# Patient Record
Sex: Female | Born: 1935 | Race: White | Hispanic: No | Smoking: Never smoker
Health system: Southern US, Community
[De-identification: ages and names within clinical notes are randomized; demographics above are authoritative.]

## PROBLEM LIST (undated history)

## (undated) DIAGNOSIS — I10 Essential (primary) hypertension: Secondary | ICD-10-CM

## (undated) DIAGNOSIS — E7800 Pure hypercholesterolemia, unspecified: Secondary | ICD-10-CM

## (undated) DIAGNOSIS — K859 Acute pancreatitis without necrosis or infection, unspecified: Secondary | ICD-10-CM

## (undated) DIAGNOSIS — K5792 Diverticulitis of intestine, part unspecified, without perforation or abscess without bleeding: Secondary | ICD-10-CM

## (undated) DIAGNOSIS — E039 Hypothyroidism, unspecified: Secondary | ICD-10-CM

## (undated) HISTORY — PX: TONSILLECTOMY: SUR1361

## (undated) HISTORY — DX: Acute pancreatitis without necrosis or infection, unspecified: K85.90

## (undated) HISTORY — DX: Diverticulitis of intestine, part unspecified, without perforation or abscess without bleeding: K57.92

---

## 2004-11-16 ENCOUNTER — Ambulatory Visit: Payer: Self-pay | Admitting: Internal Medicine

## 2007-07-11 ENCOUNTER — Ambulatory Visit: Payer: Self-pay | Admitting: Internal Medicine

## 2008-04-05 ENCOUNTER — Ambulatory Visit: Payer: Self-pay | Admitting: Internal Medicine

## 2009-05-25 ENCOUNTER — Ambulatory Visit: Payer: Self-pay | Admitting: Internal Medicine

## 2010-02-26 ENCOUNTER — Observation Stay: Payer: Self-pay | Admitting: Internal Medicine

## 2010-05-28 ENCOUNTER — Ambulatory Visit: Payer: Self-pay | Admitting: Internal Medicine

## 2010-12-05 ENCOUNTER — Encounter (INDEPENDENT_AMBULATORY_CARE_PROVIDER_SITE_OTHER): Payer: Self-pay | Admitting: Ophthalmology

## 2010-12-21 ENCOUNTER — Encounter (INDEPENDENT_AMBULATORY_CARE_PROVIDER_SITE_OTHER): Payer: Medicare Other | Admitting: Ophthalmology

## 2010-12-21 DIAGNOSIS — H43819 Vitreous degeneration, unspecified eye: Secondary | ICD-10-CM

## 2010-12-21 DIAGNOSIS — H3530 Unspecified macular degeneration: Secondary | ICD-10-CM

## 2010-12-21 DIAGNOSIS — H353290 Exudative age-related macular degeneration, unspecified eye, stage unspecified: Secondary | ICD-10-CM

## 2011-02-08 ENCOUNTER — Ambulatory Visit (INDEPENDENT_AMBULATORY_CARE_PROVIDER_SITE_OTHER): Payer: Medicare Other | Admitting: Ophthalmology

## 2011-02-08 DIAGNOSIS — H43819 Vitreous degeneration, unspecified eye: Secondary | ICD-10-CM

## 2011-02-08 DIAGNOSIS — H3530 Unspecified macular degeneration: Secondary | ICD-10-CM

## 2011-02-08 DIAGNOSIS — H353290 Exudative age-related macular degeneration, unspecified eye, stage unspecified: Secondary | ICD-10-CM

## 2011-02-08 DIAGNOSIS — H359 Unspecified retinal disorder: Secondary | ICD-10-CM

## 2011-02-08 NOTE — Nursing Note (Signed)
>>   Sherrie George, MD     Mandy George Feb 08, 2011  8:22 AM Evaluation for ARMD and Choroidal neovascularization OS

## 2011-02-17 NOTE — Progress Notes (Signed)
Complete eye exam

## 2011-02-18 ENCOUNTER — Encounter (INDEPENDENT_AMBULATORY_CARE_PROVIDER_SITE_OTHER): Payer: Medicare Other | Admitting: Ophthalmology

## 2011-04-05 ENCOUNTER — Encounter (INDEPENDENT_AMBULATORY_CARE_PROVIDER_SITE_OTHER): Payer: Medicare Other | Admitting: Ophthalmology

## 2011-04-05 DIAGNOSIS — H353290 Exudative age-related macular degeneration, unspecified eye, stage unspecified: Secondary | ICD-10-CM

## 2011-04-05 DIAGNOSIS — H43819 Vitreous degeneration, unspecified eye: Secondary | ICD-10-CM

## 2011-04-05 DIAGNOSIS — H3530 Unspecified macular degeneration: Secondary | ICD-10-CM

## 2011-05-23 ENCOUNTER — Ambulatory Visit: Payer: Self-pay | Admitting: Internal Medicine

## 2011-06-07 ENCOUNTER — Encounter (INDEPENDENT_AMBULATORY_CARE_PROVIDER_SITE_OTHER): Payer: Medicare Other | Admitting: Ophthalmology

## 2011-06-07 DIAGNOSIS — H35039 Hypertensive retinopathy, unspecified eye: Secondary | ICD-10-CM

## 2011-06-07 DIAGNOSIS — I10 Essential (primary) hypertension: Secondary | ICD-10-CM

## 2011-06-07 DIAGNOSIS — H3530 Unspecified macular degeneration: Secondary | ICD-10-CM

## 2011-06-07 DIAGNOSIS — H353290 Exudative age-related macular degeneration, unspecified eye, stage unspecified: Secondary | ICD-10-CM

## 2011-06-07 DIAGNOSIS — H43819 Vitreous degeneration, unspecified eye: Secondary | ICD-10-CM

## 2011-08-23 ENCOUNTER — Encounter (INDEPENDENT_AMBULATORY_CARE_PROVIDER_SITE_OTHER): Payer: Medicare Other | Admitting: Ophthalmology

## 2011-08-26 ENCOUNTER — Encounter (INDEPENDENT_AMBULATORY_CARE_PROVIDER_SITE_OTHER): Payer: Medicare Other | Admitting: Ophthalmology

## 2011-08-26 DIAGNOSIS — I10 Essential (primary) hypertension: Secondary | ICD-10-CM

## 2011-08-26 DIAGNOSIS — H353290 Exudative age-related macular degeneration, unspecified eye, stage unspecified: Secondary | ICD-10-CM

## 2011-08-26 DIAGNOSIS — H3530 Unspecified macular degeneration: Secondary | ICD-10-CM

## 2011-08-26 DIAGNOSIS — H43819 Vitreous degeneration, unspecified eye: Secondary | ICD-10-CM

## 2011-08-26 DIAGNOSIS — H35039 Hypertensive retinopathy, unspecified eye: Secondary | ICD-10-CM

## 2011-12-02 ENCOUNTER — Encounter (INDEPENDENT_AMBULATORY_CARE_PROVIDER_SITE_OTHER): Payer: Medicare Other | Admitting: Ophthalmology

## 2011-12-02 DIAGNOSIS — H3530 Unspecified macular degeneration: Secondary | ICD-10-CM

## 2011-12-02 DIAGNOSIS — H353290 Exudative age-related macular degeneration, unspecified eye, stage unspecified: Secondary | ICD-10-CM

## 2011-12-02 DIAGNOSIS — H43819 Vitreous degeneration, unspecified eye: Secondary | ICD-10-CM

## 2011-12-02 DIAGNOSIS — H35039 Hypertensive retinopathy, unspecified eye: Secondary | ICD-10-CM

## 2011-12-02 DIAGNOSIS — I10 Essential (primary) hypertension: Secondary | ICD-10-CM

## 2012-01-08 ENCOUNTER — Ambulatory Visit: Payer: Self-pay | Admitting: Internal Medicine

## 2012-01-27 ENCOUNTER — Encounter (INDEPENDENT_AMBULATORY_CARE_PROVIDER_SITE_OTHER): Payer: Medicare Other | Admitting: Ophthalmology

## 2012-01-27 DIAGNOSIS — I10 Essential (primary) hypertension: Secondary | ICD-10-CM

## 2012-01-27 DIAGNOSIS — H35039 Hypertensive retinopathy, unspecified eye: Secondary | ICD-10-CM

## 2012-01-27 DIAGNOSIS — H3530 Unspecified macular degeneration: Secondary | ICD-10-CM

## 2012-01-27 DIAGNOSIS — H43819 Vitreous degeneration, unspecified eye: Secondary | ICD-10-CM

## 2012-03-19 ENCOUNTER — Encounter (INDEPENDENT_AMBULATORY_CARE_PROVIDER_SITE_OTHER): Payer: Medicare Other | Admitting: Ophthalmology

## 2012-03-19 DIAGNOSIS — H35349 Macular cyst, hole, or pseudohole, unspecified eye: Secondary | ICD-10-CM

## 2012-03-19 DIAGNOSIS — H43819 Vitreous degeneration, unspecified eye: Secondary | ICD-10-CM

## 2012-03-19 DIAGNOSIS — I10 Essential (primary) hypertension: Secondary | ICD-10-CM

## 2012-03-19 DIAGNOSIS — H35039 Hypertensive retinopathy, unspecified eye: Secondary | ICD-10-CM

## 2012-03-19 DIAGNOSIS — H3530 Unspecified macular degeneration: Secondary | ICD-10-CM

## 2012-07-09 ENCOUNTER — Encounter (INDEPENDENT_AMBULATORY_CARE_PROVIDER_SITE_OTHER): Payer: Medicare Other | Admitting: Ophthalmology

## 2012-07-13 ENCOUNTER — Encounter (INDEPENDENT_AMBULATORY_CARE_PROVIDER_SITE_OTHER): Payer: Medicare Other | Admitting: Ophthalmology

## 2012-07-15 ENCOUNTER — Encounter (INDEPENDENT_AMBULATORY_CARE_PROVIDER_SITE_OTHER): Payer: Medicare Other | Admitting: Ophthalmology

## 2012-07-15 DIAGNOSIS — H3530 Unspecified macular degeneration: Secondary | ICD-10-CM

## 2012-07-15 DIAGNOSIS — H35349 Macular cyst, hole, or pseudohole, unspecified eye: Secondary | ICD-10-CM

## 2012-07-15 DIAGNOSIS — I10 Essential (primary) hypertension: Secondary | ICD-10-CM

## 2012-07-15 DIAGNOSIS — H43819 Vitreous degeneration, unspecified eye: Secondary | ICD-10-CM

## 2012-07-15 DIAGNOSIS — H35039 Hypertensive retinopathy, unspecified eye: Secondary | ICD-10-CM

## 2012-10-22 ENCOUNTER — Encounter (INDEPENDENT_AMBULATORY_CARE_PROVIDER_SITE_OTHER): Payer: Medicare Other | Admitting: Ophthalmology

## 2012-10-22 DIAGNOSIS — H35349 Macular cyst, hole, or pseudohole, unspecified eye: Secondary | ICD-10-CM

## 2012-10-22 DIAGNOSIS — H43819 Vitreous degeneration, unspecified eye: Secondary | ICD-10-CM

## 2012-10-22 DIAGNOSIS — H35039 Hypertensive retinopathy, unspecified eye: Secondary | ICD-10-CM

## 2012-10-22 DIAGNOSIS — I10 Essential (primary) hypertension: Secondary | ICD-10-CM

## 2012-10-22 DIAGNOSIS — H353290 Exudative age-related macular degeneration, unspecified eye, stage unspecified: Secondary | ICD-10-CM

## 2012-10-22 DIAGNOSIS — H3530 Unspecified macular degeneration: Secondary | ICD-10-CM

## 2013-01-07 ENCOUNTER — Encounter (INDEPENDENT_AMBULATORY_CARE_PROVIDER_SITE_OTHER): Payer: Medicare Other | Admitting: Ophthalmology

## 2013-01-07 DIAGNOSIS — I10 Essential (primary) hypertension: Secondary | ICD-10-CM

## 2013-01-07 DIAGNOSIS — H43819 Vitreous degeneration, unspecified eye: Secondary | ICD-10-CM

## 2013-01-07 DIAGNOSIS — H35039 Hypertensive retinopathy, unspecified eye: Secondary | ICD-10-CM

## 2013-01-07 DIAGNOSIS — H3530 Unspecified macular degeneration: Secondary | ICD-10-CM

## 2013-04-08 ENCOUNTER — Encounter (INDEPENDENT_AMBULATORY_CARE_PROVIDER_SITE_OTHER): Payer: Medicare Other | Admitting: Ophthalmology

## 2013-04-08 DIAGNOSIS — H43819 Vitreous degeneration, unspecified eye: Secondary | ICD-10-CM

## 2013-04-08 DIAGNOSIS — H35039 Hypertensive retinopathy, unspecified eye: Secondary | ICD-10-CM

## 2013-04-08 DIAGNOSIS — I10 Essential (primary) hypertension: Secondary | ICD-10-CM

## 2013-04-08 DIAGNOSIS — H35349 Macular cyst, hole, or pseudohole, unspecified eye: Secondary | ICD-10-CM

## 2013-04-08 DIAGNOSIS — H3530 Unspecified macular degeneration: Secondary | ICD-10-CM

## 2013-05-19 ENCOUNTER — Ambulatory Visit: Payer: Self-pay | Admitting: Internal Medicine

## 2013-07-25 ENCOUNTER — Emergency Department: Payer: Self-pay | Admitting: Emergency Medicine

## 2013-07-25 LAB — COMPREHENSIVE METABOLIC PANEL
ALK PHOS: 93 U/L
ANION GAP: 5 — AB (ref 7–16)
Albumin: 3.8 g/dL (ref 3.4–5.0)
BUN: 19 mg/dL — ABNORMAL HIGH (ref 7–18)
Bilirubin,Total: 0.5 mg/dL (ref 0.2–1.0)
Calcium, Total: 9.5 mg/dL (ref 8.5–10.1)
Chloride: 98 mmol/L (ref 98–107)
Co2: 32 mmol/L (ref 21–32)
Creatinine: 1.04 mg/dL (ref 0.60–1.30)
EGFR (Non-African Amer.): 48 — ABNORMAL LOW
GFR CALC AF AMER: 56 — AB
Glucose: 95 mg/dL (ref 65–99)
Osmolality: 272 (ref 275–301)
POTASSIUM: 3.1 mmol/L — AB (ref 3.5–5.1)
SGOT(AST): 19 U/L (ref 15–37)
SGPT (ALT): 18 U/L (ref 12–78)
SODIUM: 135 mmol/L — AB (ref 136–145)
TOTAL PROTEIN: 7.6 g/dL (ref 6.4–8.2)

## 2013-07-25 LAB — CBC
HCT: 40.1 % (ref 35.0–47.0)
HGB: 13.3 g/dL (ref 12.0–16.0)
MCH: 28.6 pg (ref 26.0–34.0)
MCHC: 33.1 g/dL (ref 32.0–36.0)
MCV: 87 fL (ref 80–100)
Platelet: 327 10*3/uL (ref 150–440)
RBC: 4.63 10*6/uL (ref 3.80–5.20)
RDW: 14.2 % (ref 11.5–14.5)
WBC: 8.8 10*3/uL (ref 3.6–11.0)

## 2013-07-25 LAB — TROPONIN I: Troponin-I: <0.02 ng/mL

## 2013-08-26 ENCOUNTER — Encounter (INDEPENDENT_AMBULATORY_CARE_PROVIDER_SITE_OTHER): Payer: Medicare Other | Admitting: Ophthalmology

## 2013-09-03 ENCOUNTER — Encounter (INDEPENDENT_AMBULATORY_CARE_PROVIDER_SITE_OTHER): Payer: Medicare Other | Admitting: Ophthalmology

## 2013-09-06 ENCOUNTER — Encounter (INDEPENDENT_AMBULATORY_CARE_PROVIDER_SITE_OTHER): Payer: Medicare Other | Admitting: Ophthalmology

## 2013-09-06 DIAGNOSIS — I10 Essential (primary) hypertension: Secondary | ICD-10-CM

## 2013-09-06 DIAGNOSIS — H353290 Exudative age-related macular degeneration, unspecified eye, stage unspecified: Secondary | ICD-10-CM

## 2013-09-06 DIAGNOSIS — H35039 Hypertensive retinopathy, unspecified eye: Secondary | ICD-10-CM

## 2013-09-06 DIAGNOSIS — H43819 Vitreous degeneration, unspecified eye: Secondary | ICD-10-CM

## 2013-09-06 DIAGNOSIS — H3530 Unspecified macular degeneration: Secondary | ICD-10-CM

## 2013-11-08 ENCOUNTER — Encounter (INDEPENDENT_AMBULATORY_CARE_PROVIDER_SITE_OTHER): Payer: Medicare Other | Admitting: Ophthalmology

## 2013-11-08 DIAGNOSIS — H43819 Vitreous degeneration, unspecified eye: Secondary | ICD-10-CM

## 2013-11-08 DIAGNOSIS — H35039 Hypertensive retinopathy, unspecified eye: Secondary | ICD-10-CM

## 2013-11-08 DIAGNOSIS — H3530 Unspecified macular degeneration: Secondary | ICD-10-CM

## 2013-11-08 DIAGNOSIS — H353290 Exudative age-related macular degeneration, unspecified eye, stage unspecified: Secondary | ICD-10-CM

## 2013-11-08 DIAGNOSIS — I10 Essential (primary) hypertension: Secondary | ICD-10-CM

## 2014-01-03 ENCOUNTER — Encounter (INDEPENDENT_AMBULATORY_CARE_PROVIDER_SITE_OTHER): Payer: Medicare Other | Admitting: Ophthalmology

## 2014-01-03 DIAGNOSIS — I10 Essential (primary) hypertension: Secondary | ICD-10-CM

## 2014-01-03 DIAGNOSIS — H35039 Hypertensive retinopathy, unspecified eye: Secondary | ICD-10-CM

## 2014-01-03 DIAGNOSIS — H353290 Exudative age-related macular degeneration, unspecified eye, stage unspecified: Secondary | ICD-10-CM

## 2014-01-03 DIAGNOSIS — H3530 Unspecified macular degeneration: Secondary | ICD-10-CM

## 2014-01-03 DIAGNOSIS — H43819 Vitreous degeneration, unspecified eye: Secondary | ICD-10-CM

## 2014-02-24 ENCOUNTER — Encounter (INDEPENDENT_AMBULATORY_CARE_PROVIDER_SITE_OTHER): Payer: Medicare Other | Admitting: Ophthalmology

## 2014-02-24 DIAGNOSIS — H3531 Nonexudative age-related macular degeneration: Secondary | ICD-10-CM

## 2014-02-24 DIAGNOSIS — H43813 Vitreous degeneration, bilateral: Secondary | ICD-10-CM

## 2014-02-24 DIAGNOSIS — H35342 Macular cyst, hole, or pseudohole, left eye: Secondary | ICD-10-CM

## 2014-02-24 DIAGNOSIS — H35033 Hypertensive retinopathy, bilateral: Secondary | ICD-10-CM

## 2014-02-24 DIAGNOSIS — I10 Essential (primary) hypertension: Secondary | ICD-10-CM

## 2014-02-24 DIAGNOSIS — H3532 Exudative age-related macular degeneration: Secondary | ICD-10-CM

## 2014-05-19 ENCOUNTER — Encounter (INDEPENDENT_AMBULATORY_CARE_PROVIDER_SITE_OTHER): Payer: Medicare Other | Admitting: Ophthalmology

## 2014-05-19 DIAGNOSIS — I10 Essential (primary) hypertension: Secondary | ICD-10-CM

## 2014-05-19 DIAGNOSIS — H3531 Nonexudative age-related macular degeneration: Secondary | ICD-10-CM

## 2014-05-19 DIAGNOSIS — H43813 Vitreous degeneration, bilateral: Secondary | ICD-10-CM

## 2014-05-19 DIAGNOSIS — H35033 Hypertensive retinopathy, bilateral: Secondary | ICD-10-CM

## 2014-05-19 DIAGNOSIS — H35342 Macular cyst, hole, or pseudohole, left eye: Secondary | ICD-10-CM

## 2014-09-15 ENCOUNTER — Encounter (INDEPENDENT_AMBULATORY_CARE_PROVIDER_SITE_OTHER): Payer: Medicare Other | Admitting: Ophthalmology

## 2014-09-19 ENCOUNTER — Encounter (INDEPENDENT_AMBULATORY_CARE_PROVIDER_SITE_OTHER): Payer: Medicare Other | Admitting: Ophthalmology

## 2014-09-19 DIAGNOSIS — H43813 Vitreous degeneration, bilateral: Secondary | ICD-10-CM | POA: Diagnosis not present

## 2014-09-19 DIAGNOSIS — H35342 Macular cyst, hole, or pseudohole, left eye: Secondary | ICD-10-CM

## 2014-09-19 DIAGNOSIS — H35033 Hypertensive retinopathy, bilateral: Secondary | ICD-10-CM | POA: Diagnosis not present

## 2014-09-19 DIAGNOSIS — I10 Essential (primary) hypertension: Secondary | ICD-10-CM

## 2014-09-19 DIAGNOSIS — H3531 Nonexudative age-related macular degeneration: Secondary | ICD-10-CM

## 2015-01-16 ENCOUNTER — Emergency Department: Payer: Medicare Other

## 2015-01-16 ENCOUNTER — Encounter: Payer: Self-pay | Admitting: Emergency Medicine

## 2015-01-16 ENCOUNTER — Emergency Department
Admission: EM | Admit: 2015-01-16 | Discharge: 2015-01-16 | Disposition: A | Discharge status: Home / Self Care | Payer: Medicare Other | Attending: Emergency Medicine | Admitting: Emergency Medicine

## 2015-01-16 DIAGNOSIS — R110 Nausea: Secondary | ICD-10-CM | POA: Diagnosis present

## 2015-01-16 DIAGNOSIS — I10 Essential (primary) hypertension: Secondary | ICD-10-CM | POA: Diagnosis not present

## 2015-01-16 DIAGNOSIS — K5792 Diverticulitis of intestine, part unspecified, without perforation or abscess without bleeding: Secondary | ICD-10-CM | POA: Diagnosis not present

## 2015-01-16 HISTORY — DX: Hypothyroidism, unspecified: E03.9

## 2015-01-16 HISTORY — DX: Pure hypercholesterolemia, unspecified: E78.00

## 2015-01-16 HISTORY — DX: Essential (primary) hypertension: I10

## 2015-01-16 LAB — URINALYSIS COMPLETE WITH MICROSCOPIC (ARMC ONLY)
BILIRUBIN URINE: NEGATIVE
Bacteria, UA: NONE SEEN
GLUCOSE, UA: NEGATIVE mg/dL
Ketones, ur: NEGATIVE mg/dL
Leukocytes, UA: NEGATIVE
NITRITE: NEGATIVE
Protein, ur: NEGATIVE mg/dL
SPECIFIC GRAVITY, URINE: 1.013 (ref 1.005–1.030)
Squamous Epithelial / LPF: NONE SEEN
pH: 7 (ref 5.0–8.0)

## 2015-01-16 LAB — CBC WITH DIFFERENTIAL/PLATELET
BASOS PCT: 1 %
Basophils Absolute: 0 10*3/uL (ref 0–0.1)
EOS ABS: 0.1 10*3/uL (ref 0–0.7)
Eosinophils Relative: 1 %
HEMATOCRIT: 38 % (ref 35.0–47.0)
Hemoglobin: 13 g/dL (ref 12.0–16.0)
LYMPHS ABS: 1.2 10*3/uL (ref 1.0–3.6)
Lymphocytes Relative: 14 %
MCH: 30.2 pg (ref 26.0–34.0)
MCHC: 34.1 g/dL (ref 32.0–36.0)
MCV: 88.3 fL (ref 80.0–100.0)
Monocytes Absolute: 0.6 10*3/uL (ref 0.2–0.9)
Monocytes Relative: 7 %
NEUTROS PCT: 77 %
Neutro Abs: 6.6 10*3/uL — ABNORMAL HIGH (ref 1.4–6.5)
Platelets: 295 10*3/uL (ref 150–440)
RBC: 4.31 MIL/uL (ref 3.80–5.20)
RDW: 13.6 % (ref 11.5–14.5)
WBC: 8.5 10*3/uL (ref 3.6–11.0)

## 2015-01-16 LAB — LIPASE, BLOOD: Lipase: 33 U/L (ref 22–51)

## 2015-01-16 LAB — COMPREHENSIVE METABOLIC PANEL
ALBUMIN: 3.9 g/dL (ref 3.5–5.0)
ALK PHOS: 63 U/L (ref 38–126)
ALT: 15 U/L (ref 14–54)
AST: 27 U/L (ref 15–41)
Anion gap: 11 (ref 5–15)
BUN: 9 mg/dL (ref 6–20)
CALCIUM: 8.7 mg/dL — AB (ref 8.9–10.3)
CO2: 27 mmol/L (ref 22–32)
CREATININE: 1.06 mg/dL — AB (ref 0.44–1.00)
Chloride: 89 mmol/L — ABNORMAL LOW (ref 101–111)
GFR calc Af Amer: 52 mL/min — ABNORMAL LOW (ref >60)
GFR calc non Af Amer: 45 mL/min — ABNORMAL LOW (ref >60)
GLUCOSE: 109 mg/dL — AB (ref 65–99)
Potassium: 3.1 mmol/L — ABNORMAL LOW (ref 3.5–5.1)
SODIUM: 127 mmol/L — AB (ref 135–145)
Total Bilirubin: 1.1 mg/dL (ref 0.3–1.2)
Total Protein: 6.8 g/dL (ref 6.5–8.1)

## 2015-01-16 LAB — LACTIC ACID, PLASMA: LACTIC ACID, VENOUS: 0.9 mmol/L (ref 0.5–2.0)

## 2015-01-16 LAB — TROPONIN I

## 2015-01-16 MED ORDER — ONDANSETRON HCL 4 MG/2ML IJ SOLN
INTRAMUSCULAR | Status: AC
  Administered 2015-01-16: 4 mg
  Filled 2015-01-16: qty 2

## 2015-01-16 MED ORDER — SODIUM CHLORIDE 0.9 % IV BOLUS (SEPSIS)
500.0000 mL | INTRAVENOUS | Status: AC
  Administered 2015-01-16: 500 mL via INTRAVENOUS

## 2015-01-16 MED ORDER — METRONIDAZOLE 500 MG PO TABS
500.0000 mg | ORAL_TABLET | Freq: Three times a day (TID) | ORAL | Status: AC
Start: 2015-01-16 — End: 2015-01-23

## 2015-01-16 MED ORDER — TRAMADOL HCL 50 MG PO TABS: 50.0000 mg | ORAL_TABLET | Freq: Four times a day (QID) | ORAL | Status: DC | PRN

## 2015-01-16 MED ORDER — IOHEXOL 240 MG/ML SOLN
25.0000 mL | INTRAMUSCULAR | Status: AC
  Administered 2015-01-16: 25 mL via ORAL

## 2015-01-16 MED ORDER — IOHEXOL 300 MG/ML  SOLN
75.0000 mL | Freq: Once | INTRAMUSCULAR | Status: AC | PRN
  Administered 2015-01-16: 75 mL via INTRAVENOUS

## 2015-01-16 MED ORDER — IOHEXOL 300 MG/ML  SOLN: 80.0000 mL | Freq: Once | INTRAMUSCULAR | Status: DC | PRN

## 2015-01-16 MED ORDER — CIPROFLOXACIN HCL 500 MG PO TABS: 500.0000 mg | ORAL_TABLET | Freq: Two times a day (BID) | ORAL | Status: AC

## 2015-01-16 NOTE — ED Notes (Signed)
Assumed pt care at this time. NAD noted. RR even and nonlabored. Family remains at bedside. Pt assisted to restroom in room without incident. Will continue to monitor.

## 2015-01-16 NOTE — Discharge Instructions (Signed)
We believe your symptoms are caused by diverticulitis.  Most of the time this condition (please read through the included information) can be cured with outpatient antibiotics.  Please take the full course of prescribed medication(s) and follow up with the doctors recommended above.  Return to the ED if your abdominal pain worsens or fails to improve, you develop bloody vomiting, bloody diarrhea, you are unable to tolerate fluids due to vomiting, fever, or other symptoms that concern you.  Take Tramadol as prescribed for severe pain. Do not drink alcohol, drive or participate in any other potentially dangerous activities while taking this medication as it may make you sleepy. Do not take this medication with any other sedating medications, either prescription or over-the-counter. If you were prescribed Percocet, Tramadol, or Vicodin, do not take these with acetaminophen (Tylenol) as it is already contained within these medications.   This medication is an opiate (or narcotic) pain medication and can be habit forming.  Use it as little as possible to achieve adequate pain control.  Do not use or use it with extreme caution if you have a history of opiate abuse or dependence.  If you are on a pain contract with your primary care doctor or a pain specialist, be sure to let them know you were prescribed this medication today from the [facility].  This medication is intended for your use only - do not give any to anyone else and keep it in a secure place where nobody else, especially children, have access to it.  It will also cause or worsen constipation, so you may want to consider taking an over-the-counter stool softener while you are taking this medication.   Diverticulitis Diverticulitis is inflammation or infection of small pouches in your colon that form when you have a condition called diverticulosis. The pouches in your colon are called diverticula. Your colon, or large  intestine, is where water is absorbed and stool is formed. Complications of diverticulitis can include:  Bleeding.  Severe infection.  Severe pain.  Perforation of your colon.  Obstruction of your colon. CAUSES  Diverticulitis is caused by bacteria. Diverticulitis happens when stool becomes trapped in diverticula. This allows bacteria to grow in the diverticula, which can lead to inflammation and infection. RISK FACTORS People with diverticulosis are at risk for diverticulitis. Eating a diet that does not include enough fiber from fruits and vegetables may make diverticulitis more likely to develop. SYMPTOMS  Symptoms of diverticulitis may include:  Abdominal pain and tenderness. The pain is normally located on the left side of the abdomen, but may occur in other areas.  Fever and chills.  Bloating.  Cramping.  Nausea.  Vomiting.  Constipation.  Diarrhea.  Blood in your stool. DIAGNOSIS  Your health care provider will ask you about your medical history and do a physical exam. You may need to have tests done because many medical conditions can cause the same symptoms as diverticulitis. Tests may include:  Blood tests.  Urine tests.  Imaging tests of the abdomen, including X-rays and CT scans. When your condition is under control, your health care provider may recommend that you have a colonoscopy. A colonoscopy can show how severe your diverticula are and whether something else is causing your symptoms. TREATMENT  Most cases of diverticulitis are mild and can be treated at home. Treatment may include:  Taking over-the-counter pain medicines.  Following a clear liquid diet.  Taking antibiotic medicines by mouth for 7-10 days. More severe cases may  be treated at a hospital. Treatment may include:  Not eating or drinking.  Taking prescription pain medicine.  Receiving antibiotic medicines through an IV tube.  Receiving fluids and nutrition through an IV  tube.  Surgery. HOME CARE INSTRUCTIONS   Follow your health care provider's instructions carefully.  Follow a full liquid diet or other diet as directed by your health care provider. After your symptoms improve, your health care provider may tell you to change your diet. He or she may recommend you eat a high-fiber diet. Fruits and vegetables are good sources of fiber. Fiber makes it easier to pass stool.  Take fiber supplements or probiotics as directed by your health care provider.  Only take medicines as directed by your health care provider.  Keep all your follow-up appointments. SEEK MEDICAL CARE IF:   Your pain does not improve.  You have a hard time eating food.  Your bowel movements do not return to normal. SEEK IMMEDIATE MEDICAL CARE IF:   Your pain becomes worse.  Your symptoms do not get better.  Your symptoms suddenly get worse.  You have a fever.  You have repeated vomiting.  You have bloody or black, tarry stools. MAKE SURE YOU:   Understand these instructions.  Will watch your condition.  Will get help right away if you are not doing well or get worse. Document Released: 01/30/2005 Document Revised: 04/27/2013 Document Reviewed: 03/17/2013 [facility] Patient Information 2015 [place], [place]. This information is not intended to replace advice given to you by your health care provider. Make sure you discuss any questions you have with your health care provider.

## 2015-01-16 NOTE — ED Notes (Addendum)
Reports nausea since Thursday.  States her MD gave her antiacids but not feeling better. Today having lower abd pain. No bm since Thursday.

## 2015-01-16 NOTE — ED Provider Notes (Signed)
[facility] Emergency Department Provider Note  ____________________________________________  Time seen: Approximately 5:43 PM  I have reviewed the triage vital signs and the nursing notes.   HISTORY  Chief Complaint Nausea    HPI Mandy George is a 81 y.o. female who is generally well for her agewho presents with intermittent left lower quadrant abdominal pain for about 2 weeks.  It has been significantly worse over the last several days and accompanied with nausea but no vomiting.  She is also not had a bowel movement for several days, but it sounds like this may not be unusual for her.  She saw her regular doctor last week and was started on Bentyl, Zofran, and an medication for GERD.  She seemed to be doing better for a while until the pain became much more intense yesterday.  Comes in waves and has acute onset and is very severe while it is present.  She stated that last about an hour before he gets better.  She is not noted any changes in her bowel habits.  She denies chest pain or shortness of breath as well as fever and chills.  Currently she is in no pain/distress and is denying any nausea.   Past Medical History  Diagnosis Date  . Thyroid activity decreased   . Hypercholesteremia   . Hypertension     There are no active problems to display for this patient.   History reviewed. No pertinent past surgical history.  Current Outpatient Rx  Name  Route  Sig  Dispense  Refill  . ciprofloxacin (CIPRO) 500 MG tablet   Oral   Take 1 tablet (500 mg total) by mouth 2 (two) times daily.   20 tablet   0   . metroNIDAZOLE (FLAGYL) 500 MG tablet   Oral   Take 1 tablet (500 mg total) by mouth 3 (three) times daily.   30 tablet   0   . traMADol (ULTRAM) 50 MG tablet   Oral   Take 1 tablet (50 mg total) by mouth every 6 (six) hours as needed.   20 tablet   0     Allergies Eggs or egg-derived products; Mushroom extract complex; and  Shrimp  History reviewed. No pertinent family history.  Social History Social History  Substance Use Topics  . Smoking status: Never Smoker   . Smokeless tobacco: None  . Alcohol Use: No    Review of Systems Constitutional: No fever/chills Eyes: No visual changes. ENT: No sore throat. Cardiovascular: Denies chest pain. Respiratory: Denies shortness of breath. Gastrointestinal: Intermittent left lower quadrant severe abdominal pain with nausea but no vomiting.  Possible constipation for several days. Genitourinary: Negative for dysuria. Musculoskeletal: Negative for back pain. Skin: Negative for rash. Neurological: Negative for headaches, focal weakness or numbness.  10-point ROS otherwise negative.  ____________________________________________   PHYSICAL EXAM:  VITAL SIGNS: ED Triage Vitals  Enc Vitals Group     BP 01/16/15 1614 147/68 mmHg     Pulse Rate 01/16/15 1614 68     Resp 01/16/15 1614 20     Temp 01/16/15 1614 98.5 F (36.9 C)     Temp Source 01/16/15 1614 Oral     SpO2 01/16/15 1614 96 %     Weight 01/16/15 1614 120 lb (54.432 kg)     Height 01/16/15 1614 5\' 2"  (1.575 m)     Head Cir --      Peak Flow --      Pain Score 01/16/15  1615 5     Pain Loc --      Pain Edu? --      Excl. in GC? --     Constitutional: Alert and oriented. Well appearing for age and in no acute distress. Eyes: Conjunctivae are normal. PERRL. EOMI. Head: Atraumatic. Nose: No congestion/rhinnorhea. Mouth/Throat: Mucous membranes are moist.  Oropharynx non-erythematous. Neck: No stridor.   Cardiovascular: Normal rate, regular rhythm. Grossly normal heart sounds.  Good peripheral circulation. Respiratory: Normal respiratory effort.  No retractions. Lungs CTAB. Gastrointestinal: Soft.  Severe tenderness and guarding to LLQ.  No rebound tenderness.  Palpation of RLQ causes pain/tenderness in LLQ. Musculoskeletal: No lower extremity tenderness nor edema.  No joint  effusions. Neurologic:  Normal speech and language. No gross focal neurologic deficits are appreciated.  Skin:  Skin is warm, dry and intact. No rash noted. Psychiatric: Mood and affect are normal. Speech and behavior are normal.  ____________________________________________   LABS (all labs ordered are listed, but only abnormal results are displayed)  Labs Reviewed  CBC WITH DIFFERENTIAL/PLATELET - Abnormal; Notable for the following:    Neutro Abs 6.6 (*)    All other components within normal limits  COMPREHENSIVE METABOLIC PANEL - Abnormal; Notable for the following:    Sodium 127 (*)    Potassium 3.1 (*)    Chloride 89 (*)    Glucose, Bld 109 (*)    Creatinine, Ser 1.06 (*)    Calcium 8.7 (*)    GFR calc non Af Amer 45 (*)    GFR calc Af Amer 52 (*)    All other components within normal limits  URINALYSIS COMPLETEWITH MICROSCOPIC ([facility] ONLY) - Abnormal; Notable for the following:    Color, Urine COLORLESS (*)    APPearance CLEAR (*)    Hgb urine dipstick 1+ (*)    All other components within normal limits  LIPASE, BLOOD  LACTIC ACID, PLASMA  TROPONIN I   ____________________________________________  EKG  ED ECG REPORT I, Latyra Jaye, the attending physician, personally viewed and interpreted this ECG.  Date: 01/16/2015 EKG Time: 16:31 Rate: 64 Rhythm: sinus rhythm with first-degree AV block QRS Axis: normal Intervals: Borderline first-degree AV block  ST/T Wave abnormalities: normal Conduction Disutrbances: none Narrative Interpretation: unremarkable  ____________________________________________  RADIOLOGY   Ct Abdomen Pelvis W Contrast  01/16/2015   CLINICAL DATA:  Nausea 4 days with lower abdominal pain today and tender abdomen on exam.  EXAM: CT ABDOMEN AND PELVIS WITH CONTRAST  TECHNIQUE: Multidetector CT imaging of the abdomen and pelvis was performed using the standard protocol following bolus administration of intravenous contrast.  CONTRAST:   75mL OMNIPAQUE IOHEXOL 300 MG/ML  SOLN  COMPARISON:  None.  FINDINGS: Lung bases are within normal.  Abdominal images demonstrate a few tiny sub cm hypodensities within the liver likely cysts. 3.4 cm hypodensity over the lateral segment of the left lobe of the liver unchanged on the delayed images and likely a minimally complicated cyst. Calcified granuloma over the spleen. Possible pancreatic divisum is present.  The gallbladder and adrenal glands are normal. Kidneys are normal in size with a couple tiny sub cm renal cortical hypodensities likely cysts, but too small to characterize. Ureters are within normal.  Small bowel is within normal. Appendix is normal. There is mild inflammatory change adjacent the sigmoid colon in the left lower quadrant with minimal free fluid in the left pericolic gutter. Moderate diverticulosis of the sigmoid colon. These findings likely due to mild acute diverticulitis. No evidence  of perforation or abscess. No evidence of focal mass or adenopathy.  There is moderate calcified plaque over the abdominal aorta and iliac arteries.  Pelvic images demonstrate the bladder, rectum, uterus and adnexa to be within normal.  There mild degenerate changes of the spinal multilevel disc disease over the lumbar spine. Mild degenerate change of the hips.  IMPRESSION: Evidence of mild acute diverticulitis over the left lower quadrant involving the sigmoid colon. No perforation or abscess.  3.4 cm hypodensity over the left lobe of the liver likely minimally complicated cyst. Few other smaller subcentimeter liver hypodensities likely cysts.  Few small sub cm renal cortical hypodensities too small to characterize, but likely cysts.   Electronically Signed   By: Elberta Fortis M.D.   On: 01/16/2015 20:08    ____________________________________________   PROCEDURES  Procedure(s) performed: None  Critical Care performed: No ____________________________________________   INITIAL IMPRESSION /  ASSESSMENT AND PLAN / ED COURSE  Pertinent labs & imaging results that were available during my care of the patient were reviewed by me and considered in my medical decision making (see chart for details).  The patient is well-appearing with normal vital signs but has severe tenderness to palpation of her left lower quadrant.  She does not have peritonitis at this time but I am concerned about the degree of tenderness.  I will evaluate her with a CT scan of her abdomen and pelvis.  Differential diagnosis includes but is not exclusive to acute diverticulitis, acute appendicitis, urinary tract infection, bowel obstruction, colitis, renal colic, gastroenteritis, etc.  She states she does not need pain or nausea medication at this time.   (Note that documentation was delayed due to multiple ED patients requiring immediate care.)   The patient remained comfortable and other than some persistent nausea after the by mouth contrast, did not have any other complaints.  She never vomited.  Her CT scan was consistent with mild diverticulitis and no evidence of perforation or free air.  On my reassessment the patient continues to have tenderness to palpation.  I discussed outpatient treatment versus inpatient treatment with the patient and her daughter.  After an extensive discussion of the risks and benefits, they both agreed that they wanted to try empiric outpatient treatment with Cipro and Flagyl.  I gave him very strict return precautions given her age and the fact that she can get significantly worse very quickly.  They both understand and agree, and I think that this is appropriate care as she may actually have additional issues such as Hospital induced delirium and nosocomial infections if she were to come in for care.  Additionally, she is tolerating by mouth intake.  She also has Zofran at home as well as Bentyl.  She is going to follow up in a couple of days with her primary care doctor and I gave her  return precautions should she get worse or not improve. ____________________________________________  FINAL CLINICAL IMPRESSION(S) / ED DIAGNOSES  Final diagnoses:  Acute diverticulitis      NEW MEDICATIONS STARTED DURING THIS VISIT:  Discharge Medication List as of 01/16/2015  8:47 PM    START taking these medications   Details  ciprofloxacin (CIPRO) 500 MG tablet Take 1 tablet (500 mg total) by mouth 2 (two) times daily., Starting 01/16/2015, Until Mon 01/23/15, Print    metroNIDAZOLE (FLAGYL) 500 MG tablet Take 1 tablet (500 mg total) by mouth 3 (three) times daily., Starting 01/16/2015, Until Mon 01/23/15, Print    traMADol Janean Sark)  50 MG tablet Take 1 tablet (50 mg total) by mouth every 6 (six) hours as needed., Starting 01/16/2015, Until Tue 01/16/16, Print         Loleta Rose, MD 01/17/15 1506

## 2015-01-16 NOTE — ED Notes (Signed)
MD at bedside for reeval

## 2015-01-16 NOTE — ED Notes (Signed)
Pt returned from CT via stretcher.

## 2015-01-16 NOTE — ED Notes (Signed)
Patient transported to CT via stretcher.

## 2015-01-23 ENCOUNTER — Encounter: Payer: Self-pay | Admitting: Emergency Medicine

## 2015-01-23 ENCOUNTER — Emergency Department
Admission: EM | Admit: 2015-01-23 | Discharge: 2015-01-23 | Disposition: A | Discharge status: Home / Self Care | Payer: Medicare Other | Attending: Emergency Medicine | Admitting: Emergency Medicine

## 2015-01-23 DIAGNOSIS — R1032 Left lower quadrant pain: Secondary | ICD-10-CM | POA: Diagnosis not present

## 2015-01-23 DIAGNOSIS — I10 Essential (primary) hypertension: Secondary | ICD-10-CM | POA: Insufficient documentation

## 2015-01-23 DIAGNOSIS — Z792 Long term (current) use of antibiotics: Secondary | ICD-10-CM | POA: Insufficient documentation

## 2015-01-23 DIAGNOSIS — Z79899 Other long term (current) drug therapy: Secondary | ICD-10-CM | POA: Diagnosis not present

## 2015-01-23 DIAGNOSIS — R109 Unspecified abdominal pain: Secondary | ICD-10-CM | POA: Diagnosis present

## 2015-01-23 LAB — COMPREHENSIVE METABOLIC PANEL
ALT: 26 U/L (ref 14–54)
ANION GAP: 7 (ref 5–15)
AST: 40 U/L (ref 15–41)
Albumin: 3.8 g/dL (ref 3.5–5.0)
Alkaline Phosphatase: 50 U/L (ref 38–126)
BILIRUBIN TOTAL: 0.6 mg/dL (ref 0.3–1.2)
BUN: 10 mg/dL (ref 6–20)
CHLORIDE: 91 mmol/L — AB (ref 101–111)
CO2: 26 mmol/L (ref 22–32)
Calcium: 8.7 mg/dL — ABNORMAL LOW (ref 8.9–10.3)
Creatinine, Ser: 1.06 mg/dL — ABNORMAL HIGH (ref 0.44–1.00)
GFR, EST AFRICAN AMERICAN: 52 mL/min — AB (ref >60)
GFR, EST NON AFRICAN AMERICAN: 45 mL/min — AB (ref >60)
Glucose, Bld: 107 mg/dL — ABNORMAL HIGH (ref 65–99)
POTASSIUM: 4.6 mmol/L (ref 3.5–5.1)
Sodium: 124 mmol/L — ABNORMAL LOW (ref 135–145)
TOTAL PROTEIN: 6.5 g/dL (ref 6.5–8.1)

## 2015-01-23 LAB — CBC WITH DIFFERENTIAL/PLATELET
BASOS ABS: 0.1 10*3/uL (ref 0–0.1)
Basophils Relative: 1 %
EOS PCT: 2 %
Eosinophils Absolute: 0.1 10*3/uL (ref 0–0.7)
HCT: 36.7 % (ref 35.0–47.0)
Hemoglobin: 12.5 g/dL (ref 12.0–16.0)
LYMPHS PCT: 21 %
Lymphs Abs: 1.2 10*3/uL (ref 1.0–3.6)
MCH: 29.9 pg (ref 26.0–34.0)
MCHC: 34.1 g/dL (ref 32.0–36.0)
MCV: 87.7 fL (ref 80.0–100.0)
MONO ABS: 0.6 10*3/uL (ref 0.2–0.9)
MONOS PCT: 10 %
NEUTROS ABS: 4.1 10*3/uL (ref 1.4–6.5)
Neutrophils Relative %: 66 %
PLATELETS: 306 10*3/uL (ref 150–440)
RBC: 4.18 MIL/uL (ref 3.80–5.20)
RDW: 13.6 % (ref 11.5–14.5)
WBC: 6.1 10*3/uL (ref 3.6–11.0)

## 2015-01-23 LAB — URINALYSIS COMPLETE WITH MICROSCOPIC (ARMC ONLY)
Bacteria, UA: NONE SEEN
Bilirubin Urine: NEGATIVE
Glucose, UA: NEGATIVE mg/dL
KETONES UR: NEGATIVE mg/dL
NITRITE: NEGATIVE
PROTEIN: NEGATIVE mg/dL
SPECIFIC GRAVITY, URINE: 1.009 (ref 1.005–1.030)
Squamous Epithelial / LPF: NONE SEEN
pH: 7 (ref 5.0–8.0)

## 2015-01-23 MED ORDER — SIMETHICONE 40 MG/0.6ML PO SUSP (UNIT DOSE)
40.0000 mg | Freq: Once | ORAL | Status: AC
  Administered 2015-01-23: 40 mg via ORAL
  Filled 2015-01-23 (×2): qty 0.6

## 2015-01-23 NOTE — ED Notes (Signed)
Pt states she is currently being treated for diverticulitis, states she was seen in ED last week and placed on antibiotics, states she continues to have intermittent pain and nausea, states today pain was work, no currently having pain at present

## 2015-01-23 NOTE — Discharge Instructions (Signed)
Your CT scan 1 week ago showed mild inflammatory changes consistent with mild diverticulitis. While you're still having intermittent pain, a repeat CT scanning is not indicated tonight. Your treated with symethicon. Complete the antibiotics that have been prescribed. Follow-up with Dr. Sharmon Revere tomorrow. He may use symethicon if you find it helpful.  Return to the emergency department if your pain worsens, if you have a fever, if you have confusion, or if you have other urgent concerns.  Abdominal Pain Many things can cause belly (abdominal) pain. Most times, the belly pain is not dangerous. Many cases of belly pain can be watched and treated at home. HOME CARE   Do not take medicines that help you go poop (laxatives) unless told to by your doctor.  Only take medicine as told by your doctor.  Eat or drink as told by your doctor. Your doctor will tell you if you should be on a special diet. GET HELP IF:  You do not know what is causing your belly pain.  You have belly pain while you are sick to your stomach (nauseous) or have runny poop (diarrhea).  You have pain while you pee or poop.  Your belly pain wakes you up at night.  You have belly pain that gets worse or better when you eat.  You have belly pain that gets worse when you eat fatty foods.  You have a fever. GET HELP RIGHT AWAY IF:   The pain does not go away within 2 hours.  You keep throwing up (vomiting).  The pain changes and is only in the right or left part of the belly.  You have bloody or tarry looking poop. MAKE SURE YOU:   Understand these instructions.  Will watch your condition.  Will get help right away if you are not doing well or get worse. Document Released: 10/09/2007 Document Revised: 04/27/2013 Document Reviewed: 12/30/2012 [facility] Patient Information 2015 [place], [place]. This information is not intended to replace advice given to you by your health care provider. Make sure you discuss any  questions you have with your health care provider.

## 2015-01-23 NOTE — ED Provider Notes (Signed)
[facility] Emergency Department Provider Note  ____________________________________________  Time seen: 2049  I have reviewed the triage vital signs and the nursing notes.   HISTORY  Chief Complaint Abdominal Pain     HPI Mandy George is a 81 y.o. female presents to the emergency department with abdominal pain. She describes the pain as episodic and all over. They sometimes lasts as long as an hour. She feels much better if she can pass some gas.  This is been going on over the past week, but she had a worse episode earlier today. She was seen in the emergency department approximately week ago for the same and had a CT scan which showed some mild inflammatory changes consistent with diverticulitis in the sigmoid colon. She is placed on metronidazole and ciprofloxacin. She did see her primary physician since then. She also has an appointment tomorrow with Dr. Sharmon Revere, general surgery.  At this time, the patient reports that her pain is gone and she is comfortable. She denies any nausea. She has not had any diarrhea. She and her daughter both report that she has a large amount of gas.    Past Medical History  Diagnosis Date  . Thyroid activity decreased   . Hypercholesteremia   . Hypertension     There are no active problems to display for this patient.   History reviewed. No pertinent past surgical history.  Current Outpatient Rx  Name  Route  Sig  Dispense  Refill  . amLODipine (NORVASC) 5 MG tablet   Oral   Take 5 mg by mouth daily.      3   . azithromycin (ZITHROMAX) 250 MG tablet   Oral   Take 1 tablet by mouth 1 day or 1 dose.      0   . ciprofloxacin (CIPRO) 500 MG tablet   Oral   Take 1 tablet (500 mg total) by mouth 2 (two) times daily.   20 tablet   0   . dicyclomine (BENTYL) 10 MG capsule   Oral   Take 10 mg by mouth 2 (two) times daily.      0   . fluticasone (FLONASE) 50 MCG/ACT nasal spray   Each Nare   Place 2  sprays into both nostrils See admin instructions.      1   . hydrochlorothiazide (HYDRODIURIL) 25 MG tablet   Oral   Take 25 mg by mouth daily.      5   . levothyroxine (SYNTHROID, LEVOTHROID) 88 MCG tablet   Oral   Take 1 tablet by mouth every morning.      3   . losartan (COZAAR) 100 MG tablet   Oral   Take 100 mg by mouth daily.      5   . losartan (COZAAR) 50 MG tablet   Oral   Take 50 mg by mouth daily.      5   . metoprolol succinate (TOPROL-XL) 25 MG 24 hr tablet   Oral   Take 25 mg by mouth daily.      3   . metroNIDAZOLE (FLAGYL) 500 MG tablet   Oral   Take 1 tablet (500 mg total) by mouth 3 (three) times daily.   30 tablet   0   . omeprazole (PRILOSEC) 20 MG capsule   Oral   Take 1 capsule by mouth daily.      3   . ondansetron (ZOFRAN) 4 MG tablet   Oral  Take 1 tablet by mouth as needed.      0   . traMADol (ULTRAM) 50 MG tablet   Oral   Take 1 tablet (50 mg total) by mouth every 6 (six) hours as needed.   20 tablet   0     Allergies Eggs or egg-derived products; Mushroom extract complex; and Shrimp  No family history on file.  Social History Social History  Substance Use Topics  . Smoking status: Never Smoker   . Smokeless tobacco: None  . Alcohol Use: No    Review of Systems  Constitutional: Negative for fever. ENT: Negative for sore throat. Cardiovascular: Negative for chest pain. Respiratory: Negative for shortness of breath. Gastrointestinal: Positive for abdominal pain. Genitourinary: Negative for dysuria. Musculoskeletal: No myalgias or injuries. Skin: Negative for rash. Neurological: Negative for headaches   10-point ROS otherwise negative.  ____________________________________________   PHYSICAL EXAM:  VITAL SIGNS: ED Triage Vitals  Enc Vitals Group     BP 01/23/15 1735 152/84 mmHg     Pulse Rate 01/23/15 1735 74     Resp 01/23/15 1735 18     Temp 01/23/15 1735 98.7 F (37.1 C)     Temp Source  01/23/15 1735 Oral     SpO2 01/23/15 1735 97 %     Weight 01/23/15 1735 120 lb (54.432 kg)     Height 01/23/15 1735  (1.549 m)     Head Cir --      Peak Flow --      Pain Score 01/23/15 1736 0     Pain Loc --      Pain Edu? --      Excl. in GC? --     Constitutional:  Alert, pleasant and communicative. She repeats herself just a little bit but overall appears appropriate. She is in no distress. ENT   Head: Normocephalic and atraumatic.   Nose: No congestion/rhinnorhea. Cardiovascular: Normal rate, regular rhythm, no murmur noted Respiratory:  Normal respiratory effort, no tachypnea.    Breath sounds are clear and equal bilaterally.  Gastrointestinal: Soft. The patient does have some tenderness in the left abdomen. No peritoneal signs. No distention.  Back: No muscle spasm, no tenderness, no CVA tenderness. Musculoskeletal: No deformity noted. Nontender with normal range of motion in all extremities.  No noted edema. Neurologic:  Other then some mild repetition, normal speech and language. No gross focal neurologic deficits are appreciated.  Skin:  Skin is warm, dry. No rash noted. Psychiatric: Mood and affect are normal. Speech and behavior are normal.  ____________________________________________    LABS (pertinent positives/negatives)  Labs Reviewed  COMPREHENSIVE METABOLIC PANEL - Abnormal; Notable for the following:    Sodium 124 (*)    Chloride 91 (*)    Glucose, Bld 107 (*)    Creatinine, Ser 1.06 (*)    Calcium 8.7 (*)    GFR calc non Af Amer 45 (*)    GFR calc Af Amer 52 (*)    All other components within normal limits  URINALYSIS COMPLETEWITH MICROSCOPIC ([facility] ONLY) - Abnormal; Notable for the following:    Color, Urine YELLOW (*)    APPearance CLEAR (*)    Hgb urine dipstick 1+ (*)    Leukocytes, UA TRACE (*)    All other components within normal limits  CBC WITH DIFFERENTIAL/PLATELET   ____________________________________________   INITIAL  IMPRESSION / ASSESSMENT AND PLAN / ED COURSE  Pertinent labs & imaging results that were available during my care of  the patient were reviewed by me and considered in my medical decision making (see chart for details).  Alert overall well-appearing 81 year old female in no acute distress. She does complain of intermittent left sided abdominal pain but none now.  On examination, she does have some tenderness. Given her recent CT results and overall condition, I do not believe a repeat CT scan is indicated for this patient. She is overall comfortable. As she has noted, the pain is resolved when she passes gas. I will treat her with a dose of symethicon. She has close follow-up with an appointment with general surgery tomorrow for further assessment.  ____________________________________________   FINAL CLINICAL IMPRESSION(S) / ED DIAGNOSES  Final diagnoses:  Left lower quadrant pain       Darien Ramus, MD 01/23/15 2108

## 2015-01-24 ENCOUNTER — Encounter: Payer: Self-pay | Admitting: Surgery

## 2015-01-24 ENCOUNTER — Ambulatory Visit (INDEPENDENT_AMBULATORY_CARE_PROVIDER_SITE_OTHER): Payer: Medicare Other | Admitting: Surgery

## 2015-01-24 VITALS — BP 129/77 | HR 69 | Temp 98.2°F | Ht 61.0 in | Wt 126.0 lb

## 2015-01-24 DIAGNOSIS — K5732 Diverticulitis of large intestine without perforation or abscess without bleeding: Secondary | ICD-10-CM | POA: Diagnosis not present

## 2015-01-24 NOTE — Progress Notes (Signed)
Surgery History and Physical  CC: LLQ pain. HPI: Mandy George is a pleasant 81 yo F who presented 1 week ago with acute onset LLQ pain.  CT at that time showed mild left colonic diverticulitis and significant diverticulosis.  Last colonoscopy > 10 years.  Is feeling better.  No pain currently.  Has never had this occur before  Was seen in ER yesterday with distention and pain but improved with passing flatus.  Otherwise no fevers/chills, night sweats, shortness of breath, cough, chest pain, nausea/vomiting, diarrhea/constipation, dysuria/hematuria.  Active Ambulatory Problems    Diagnosis Date Noted  . No Active Ambulatory Problems   Resolved Ambulatory Problems    Diagnosis Date Noted  . No Resolved Ambulatory Problems   Past Medical History  Diagnosis Date  . Thyroid activity decreased   . Hypercholesteremia   . Hypertension   . Pancreatitis    Past Surgical History  Procedure Laterality Date  . Tonsillectomy       Medication List       This list is accurate as of: 01/24/15  3:07 PM.  Always use your most recent med list.               ciprofloxacin 500 MG tablet  Commonly known as:  CIPRO  Take 500 mg by mouth 2 (two) times daily.     dicyclomine 10 MG capsule  Commonly known as:  BENTYL  Take 10 mg by mouth 2 (two) times daily.     fluticasone 50 MCG/ACT nasal spray  Commonly known as:  FLONASE  Place 2 sprays into both nostrils See admin instructions.     hydrochlorothiazide 25 MG tablet  Commonly known as:  HYDRODIURIL  Take 25 mg by mouth daily.     levothyroxine 88 MCG tablet  Commonly known as:  SYNTHROID, LEVOTHROID  Take 1 tablet by mouth every morning.     losartan 50 MG tablet  Commonly known as:  COZAAR  Take 50 mg by mouth daily.     metoprolol succinate 25 MG 24 hr tablet  Commonly known as:  TOPROL-XL  Take 25 mg by mouth daily.     metroNIDAZOLE 500 MG tablet  Commonly known as:  FLAGYL  Take 500 mg by mouth 3 (three) times daily.      omeprazole 20 MG capsule  Commonly known as:  PRILOSEC  Take 1 capsule by mouth daily.     ondansetron 4 MG disintegrating tablet  Commonly known as:  ZOFRAN-ODT  DISSOLVE 1 TABLET IN MOUTH EVERY 4 - 6 HOURS AS NEEDED     simethicone 125 MG chewable tablet  Commonly known as:  MYLICON  Chew 125 mg by mouth every 6 (six) hours as needed for flatulence.     simvastatin 20 MG tablet  Commonly known as:  ZOCOR  Take 20 mg by mouth daily.       Allergies  Allergen Reactions  . Eggs Or Egg-Derived Products Swelling  . Mushroom Extract Complex Swelling  . Shrimp [Shellfish Allergy] Swelling   Family History  Problem Relation Age of Onset  . Thyroid disease Mother   . Hypertension Mother   . Hyperlipidemia Sister   . Hyperlipidemia Brother    Social History   Social History  . Marital Status: Single    Spouse Name: N/A  . Number of Children: N/A  . Years of Education: N/A   Occupational History  . Not on file.   Social History Main Topics  . Smoking status:  Never Smoker   . Smokeless tobacco: Never Used  . Alcohol Use: Yes     Comment: occassional  . Drug Use: No  . Sexual Activity: Not on file   Other Topics Concern  . Not on file   Social History Narrative   ROS: Full ROS obtained, pertinent positives and negatives as above  Blood pressure 129/77, pulse 69, temperature 98.2 F (36.8 C), temperature source Oral, height  (1.549 m), weight 126 lb (57.153 kg). GEN: NAD/A&Ox3 FACE: no obvious facial trauma, normal external nose, normal external ears EYES: no scleral icterus, no conjunctivitis HEAD: normocephalic atraumatic CV: RRR, no MRG RESP: moving air well, lungs clear ABD: soft, nontender, nondistended EXT: moving all ext well, strength 5/5 NEURO: cnII-XII grossly intact, sensation intact all 4 ext  Labs: Personally reviewed, significant for WBC 6.1, 66% neutrophils  Imaging: Personally reviewed, significant for CT with mild colonic  inflammation  A/P 81 yo F with resolving mild diverticulitis, improving.  No acute surgical needs.  Continue course of abx, call or return to ER if she has increased pain, nausea/vomiting, fevers.  F/u in 3 weeks.  Will discuss possible colonoscopy at that time.

## 2015-01-24 NOTE — Patient Instructions (Signed)
Call or return to ER if you develop fever greater than 101.5, nausea/vomiting, increased pain. ° °

## 2015-01-24 NOTE — Progress Notes (Deleted)
Subjective:     Patient ID: Mandy George, female   DOB: 1923/10/03, 81 y.o.   MRN: [id number]  HPI   Review of Systems     Objective:   Physical Exam     Assessment:     ***    Plan:     ***

## 2015-01-26 ENCOUNTER — Other Ambulatory Visit: Payer: Self-pay | Admitting: Surgery

## 2015-01-27 ENCOUNTER — Other Ambulatory Visit: Payer: Self-pay

## 2015-01-27 ENCOUNTER — Other Ambulatory Visit: Payer: Self-pay | Admitting: Surgery

## 2015-01-27 DIAGNOSIS — K5732 Diverticulitis of large intestine without perforation or abscess without bleeding: Secondary | ICD-10-CM

## 2015-01-27 MED ORDER — CIPROFLOXACIN HCL 500 MG PO TABS: 500.0000 mg | ORAL_TABLET | Freq: Two times a day (BID) | ORAL | Status: DC

## 2015-01-27 MED ORDER — METRONIDAZOLE 500 MG PO TABS: 500.0000 mg | ORAL_TABLET | Freq: Three times a day (TID) | ORAL | Status: DC

## 2015-01-27 NOTE — Progress Notes (Signed)
See documentation on telephone encounter 01/27/15

## 2015-01-27 NOTE — Telephone Encounter (Signed)
Spoke with Dr. Michela Pitcher regarding this patient. He would like Cipro and Flagyl be continued another 4 days. Medications sent to pharmacy at this time.  Spoke with patient's daughter, explained that medications have been sent. Confirmed patient's upcoming appointment with Dr. Juliann Pulse.

## 2015-01-27 NOTE — Telephone Encounter (Signed)
Pt's daughter has called and stated that Dr Juliann Pulse would like for her to have a complete 14 days of antibiotics. Pt will need a refill on all antibiotics due to her only being prescribed a 10 day trial.

## 2015-02-15 ENCOUNTER — Encounter: Payer: Self-pay | Admitting: Surgery

## 2015-02-15 ENCOUNTER — Ambulatory Visit (INDEPENDENT_AMBULATORY_CARE_PROVIDER_SITE_OTHER): Payer: Medicare Other | Admitting: Surgery

## 2015-02-15 VITALS — BP 147/87 | HR 81 | Temp 97.9°F | Ht 61.0 in | Wt 119.2 lb

## 2015-02-15 DIAGNOSIS — K5732 Diverticulitis of large intestine without perforation or abscess without bleeding: Secondary | ICD-10-CM

## 2015-02-15 MED ORDER — HYDROCORTISONE ACETATE 25 MG RE SUPP: 25.0000 mg | Freq: Two times a day (BID) | RECTAL | Status: DC

## 2015-02-15 NOTE — Patient Instructions (Addendum)
Please call our office with any questions or concerns.  Please use Colace (Stool Softeners) if you stools get harder. You may buy these over the counter.

## 2015-02-15 NOTE — Progress Notes (Signed)
Progress Note  S: Doing well.  No pain, some constipation.  + flare up of hemorrhoids O:Blood pressure 147/87, pulse 81, temperature 97.9 F (36.6 C), temperature source Oral, height 5\' 1"  (1.549 m), weight 119 lb 3.2 oz (54.069 kg). GEN: NAD/A&Ox3 ABD: soft, nontender, nondistended  A/P 81 yo s/p recent diverticulitis, resolved - normally would recommend colonoscopy.  Will discuss with her PCP Dr. Yves DillNeelam Khan regarding this recommendation - rx for hydrocortisone suppositories

## 2015-02-23 ENCOUNTER — Encounter (INDEPENDENT_AMBULATORY_CARE_PROVIDER_SITE_OTHER): Payer: Medicare Other | Admitting: Ophthalmology

## 2015-02-27 ENCOUNTER — Encounter (INDEPENDENT_AMBULATORY_CARE_PROVIDER_SITE_OTHER): Payer: Medicare Other | Admitting: Ophthalmology

## 2015-02-27 DIAGNOSIS — H353114 Nonexudative age-related macular degeneration, right eye, advanced atrophic with subfoveal involvement: Secondary | ICD-10-CM | POA: Diagnosis not present

## 2015-02-27 DIAGNOSIS — I10 Essential (primary) hypertension: Secondary | ICD-10-CM

## 2015-02-27 DIAGNOSIS — H43813 Vitreous degeneration, bilateral: Secondary | ICD-10-CM | POA: Diagnosis not present

## 2015-02-27 DIAGNOSIS — H35033 Hypertensive retinopathy, bilateral: Secondary | ICD-10-CM | POA: Diagnosis not present

## 2015-02-27 DIAGNOSIS — H353120 Nonexudative age-related macular degeneration, left eye, stage unspecified: Secondary | ICD-10-CM | POA: Diagnosis not present

## 2015-02-27 DIAGNOSIS — H35342 Macular cyst, hole, or pseudohole, left eye: Secondary | ICD-10-CM | POA: Diagnosis not present

## 2015-03-23 ENCOUNTER — Encounter (INDEPENDENT_AMBULATORY_CARE_PROVIDER_SITE_OTHER): Payer: Medicare Other | Admitting: Ophthalmology

## 2015-03-23 DIAGNOSIS — H353114 Nonexudative age-related macular degeneration, right eye, advanced atrophic with subfoveal involvement: Secondary | ICD-10-CM

## 2015-03-23 DIAGNOSIS — H353221 Exudative age-related macular degeneration, left eye, with active choroidal neovascularization: Secondary | ICD-10-CM | POA: Diagnosis not present

## 2015-04-04 ENCOUNTER — Encounter (INDEPENDENT_AMBULATORY_CARE_PROVIDER_SITE_OTHER): Payer: Medicare Other | Admitting: Ophthalmology

## 2015-04-29 ENCOUNTER — Emergency Department
Admission: EM | Admit: 2015-04-29 | Discharge: 2015-04-29 | Disposition: A | Discharge status: Home / Self Care | Payer: Medicare Other | Attending: Emergency Medicine | Admitting: Emergency Medicine

## 2015-04-29 DIAGNOSIS — I159 Secondary hypertension, unspecified: Secondary | ICD-10-CM

## 2015-04-29 DIAGNOSIS — R011 Cardiac murmur, unspecified: Secondary | ICD-10-CM | POA: Diagnosis not present

## 2015-04-29 DIAGNOSIS — R531 Weakness: Secondary | ICD-10-CM | POA: Insufficient documentation

## 2015-04-29 DIAGNOSIS — I10 Essential (primary) hypertension: Secondary | ICD-10-CM | POA: Diagnosis present

## 2015-04-29 DIAGNOSIS — Z79899 Other long term (current) drug therapy: Secondary | ICD-10-CM | POA: Insufficient documentation

## 2015-04-29 LAB — CBC
HEMATOCRIT: 40.4 % (ref 35.0–47.0)
HEMOGLOBIN: 13.6 g/dL (ref 12.0–16.0)
MCH: 29.7 pg (ref 26.0–34.0)
MCHC: 33.5 g/dL (ref 32.0–36.0)
MCV: 88.5 fL (ref 80.0–100.0)
Platelets: 287 10*3/uL (ref 150–440)
RBC: 4.57 MIL/uL (ref 3.80–5.20)
RDW: 13.9 % (ref 11.5–14.5)
WBC: 5.8 10*3/uL (ref 3.6–11.0)

## 2015-04-29 LAB — BASIC METABOLIC PANEL
ANION GAP: 9 (ref 5–15)
BUN: 15 mg/dL (ref 6–20)
CALCIUM: 9 mg/dL (ref 8.9–10.3)
CO2: 27 mmol/L (ref 22–32)
Chloride: 103 mmol/L (ref 101–111)
Creatinine, Ser: 0.79 mg/dL (ref 0.44–1.00)
GLUCOSE: 100 mg/dL — AB (ref 65–99)
POTASSIUM: 3.5 mmol/L (ref 3.5–5.1)
SODIUM: 139 mmol/L (ref 135–145)

## 2015-04-29 NOTE — ED Notes (Signed)
Pt reports having intermittent pain/discomfort in both ears.

## 2015-04-29 NOTE — ED Provider Notes (Signed)
[facility] Emergency Department Provider Note    ____________________________________________  Time seen: [id number]  I have reviewed the triage vital signs and the nursing notes.   HISTORY  Chief Complaint Hypertension and Weakness   History limited by: Not Limited   HPI Mandy George is a 81 y.o. female who presents to the emergency department today because of concerns for high blood pressure. She states her blood pressure is been high for the past 3 days. She has been checking it frequently over the past couple of days. She checked it yesterday and talked to her physician who would not be able to see her in the office and advised her to be seen in the ED if it continued. Tonight when she checked her blood pressure upon awakening it was still elevated. She denied any headache, chest pain or weakness.   Past Medical History  Diagnosis Date  . Thyroid activity decreased   . Hypercholesteremia   . Hypertension   . Pancreatitis   . Diverticulitis     There are no active problems to display for this patient.   Past Surgical History  Procedure Laterality Date  . Tonsillectomy      Current Outpatient Rx  Name  Route  Sig  Dispense  Refill  . furosemide (LASIX) 20 MG tablet   Oral   Take 20 mg by mouth daily.      9   . hydrocortisone (ANUSOL-HC) 25 MG suppository   Rectal   Place 1 suppository (25 mg total) rectally 2 (two) times daily.   20 suppository   0   . levothyroxine (SYNTHROID, LEVOTHROID) 88 MCG tablet   Oral   Take 1 tablet by mouth every morning.      3   . losartan (COZAAR) 50 MG tablet   Oral   Take 50 mg by mouth daily.      5   . metoprolol succinate (TOPROL-XL) 25 MG 24 hr tablet   Oral   Take 25 mg by mouth daily.      3   . potassium chloride 20 MEQ/15ML (10%) SOLN   Oral   Take 1 mEq by mouth 1 day or 1 dose.      0   . simvastatin (ZOCOR) 20 MG tablet   Oral   Take 20 mg by mouth daily.      3      Allergies Ivp dye; Eggs or egg-derived products; Mushroom extract complex; and Shrimp  Family History  Problem Relation Age of Onset  . Thyroid disease Mother   . Hypertension Mother   . Hyperlipidemia Sister   . Hyperlipidemia Brother     Social History Social History  Substance Use Topics  . Smoking status: Never Smoker   . Smokeless tobacco: Never Used  . Alcohol Use: Yes     Comment: occassional    Review of Systems  Constitutional: Negative for fever. Cardiovascular: Negative for chest pain. Respiratory: Negative for shortness of breath. Gastrointestinal: Negative for abdominal pain, vomiting and diarrhea. Neurological: Negative for headaches, focal weakness or numbness.  10-point ROS otherwise negative.  ____________________________________________   PHYSICAL EXAM:  VITAL SIGNS: ED Triage Vitals  Enc Vitals Group     BP 04/29/15 0326 193/105 mmHg     Pulse Rate 04/29/15 0326 75     Resp 04/29/15 0326 14     Temp 04/29/15 0326 97.9 F (36.6 C)     Temp Source 04/29/15 0326 Oral  SpO2 04/29/15 0326 95 %     Weight 04/29/15 0326 120 lb (54.432 kg)     Height 04/29/15 0326  (1.575 m)     Head Cir --      Peak Flow --      Pain Score 04/29/15 0342 0   Constitutional: Alert and oriented. Well appearing and in no distress. Eyes: Conjunctivae are normal. PERRL. Normal extraocular movements. ENT   Head: Normocephalic and atraumatic.   Nose: No congestion/rhinnorhea.   Mouth/Throat: Mucous membranes are moist.   Neck: No stridor. Hematological/Lymphatic/Immunilogical: No cervical lymphadenopathy. Cardiovascular: Normal rate, regular rhythm.  Systolic 2/6 murmur Respiratory: Normal respiratory effort without tachypnea nor retractions. Breath sounds are clear and equal bilaterally. No wheezes/rales/rhonchi. Gastrointestinal: Soft and nontender. No distention.  Genitourinary: Deferred Musculoskeletal: Normal range of motion in all  extremities. No joint effusions.  No lower extremity tenderness nor edema. Neurologic:  Normal speech and language. No gross focal neurologic deficits are appreciated.  Skin:  Skin is warm, dry and intact. No rash noted. Psychiatric: Mood and affect are normal. Speech and behavior are normal. Patient exhibits appropriate insight and judgment.  ____________________________________________    LABS (pertinent positives/negatives)  Labs Reviewed  BASIC METABOLIC PANEL - Abnormal; Notable for the following:    Glucose, Bld 100 (*)    All other components within normal limits  CBC     ____________________________________________   EKG  I, Phineas Semen, attending physician, personally viewed and interpreted this EKG  EKG Time: 0401 Rate: 65 Rhythm: normal sinus rhythm Axis: normal Intervals: qtc 430 QRS: narrow ST changes: no st elevation Impression: normal ekg ____________________________________________    RADIOLOGY  None   ____________________________________________   PROCEDURES  Procedure(s) performed: None  Critical Care performed: No  ____________________________________________   INITIAL IMPRESSION / ASSESSMENT AND PLAN / ED COURSE  Pertinent labs & imaging results that were available during my care of the patient were reviewed by me and considered in my medical decision making (see chart for details).  Patient presented to the emergency department today because of concerns for high blood pressure. No symptoms. Blood work without concerning findings. EKG without concerning findings. Discussed hypertension with the patient and family. Will discharge to follow up with primary care. Discussed return precautions.  ____________________________________________   FINAL CLINICAL IMPRESSION(S) / ED DIAGNOSES  Final diagnoses:  Secondary hypertension, unspecified     Phineas Semen, MD 04/29/15 [phone]

## 2015-04-29 NOTE — ED Notes (Signed)
Reviewed d/c instructions, follow-up care, and signs/symptoms of worsening condition.  Pt verbalized understanding.

## 2015-04-29 NOTE — ED Notes (Signed)
Pt states: "I woke up feeling hot and trembly".  Pt also c/o of generalized weakness.  Pt denies, N/V/D, SOB, dizziness/lightheadedness.  Pt has had these symptoms intermittently since Tuesday, but symptoms were worse this morning.

## 2015-04-29 NOTE — ED Notes (Addendum)
Pt states has had intermittent high blood pressure all week. Pt states awoke this am, took her blood pressure and it was "high". Pt states she feels flushed, denies nausea, vomiting, diaphoresis, shob, dizziness, pt denies pain anywhere.

## 2015-04-29 NOTE — Discharge Instructions (Signed)
Please seek medical attention for any high fevers, chest pain, shortness of breath, change in behavior, persistent vomiting, bloody stool or any other new or concerning symptoms. ° ° °Hypertension °Hypertension, commonly called high blood pressure, is when the force of blood pumping through your arteries is too strong. Your arteries are the blood vessels that carry blood from your heart throughout your body. A blood pressure reading consists of a higher number over a lower number, such as 110/72. The higher number (systolic) is the pressure inside your arteries when your heart pumps. The lower number (diastolic) is the pressure inside your arteries when your heart relaxes. Ideally you want your blood pressure below 120/80. °Hypertension forces your heart to work harder to pump blood. Your arteries may become narrow or stiff. Having untreated or uncontrolled hypertension can cause heart attack, stroke, kidney disease, and other problems. °RISK FACTORS °Some risk factors for high blood pressure are controllable. Others are not.  °Risk factors you cannot control include:  °· Race. You may be at higher risk if you are African American. °· Age. Risk increases with age. °· Gender. Men are at higher risk than women before age 45 years. After age 65, women are at higher risk than men. °Risk factors you can control include: °· Not getting enough exercise or physical activity. °· Being overweight. °· Getting too much fat, sugar, calories, or salt in your diet. °· Drinking too much alcohol. °SIGNS AND SYMPTOMS °Hypertension does not usually cause signs or symptoms. Extremely high blood pressure (hypertensive crisis) may cause headache, anxiety, shortness of breath, and nosebleed. °DIAGNOSIS °To check if you have hypertension, your health care provider will measure your blood pressure while you are seated, with your arm held at the level of your heart. It should be measured at least twice using the same arm. Certain conditions  can cause a difference in blood pressure between your right and left arms. A blood pressure reading that is higher than normal on one occasion does not mean that you need treatment. If it is not clear whether you have high blood pressure, you may be asked to return on a different day to have your blood pressure checked again. Or, you may be asked to monitor your blood pressure at home for 1 or more weeks. °TREATMENT °Treating high blood pressure includes making lifestyle changes and possibly taking medicine. Living a healthy lifestyle can help lower high blood pressure. You may need to change some of your habits. °Lifestyle changes may include: °· Following the DASH diet. This diet is high in fruits, vegetables, and whole grains. It is low in salt, red meat, and added sugars. °· Keep your sodium intake below 2,300 mg per day. °· Getting at least 30-45 minutes of aerobic exercise at least 4 times per week. °· Losing weight if necessary. °· Not smoking. °· Limiting alcoholic beverages. °· Learning ways to reduce stress. °Your health care provider may prescribe medicine if lifestyle changes are not enough to get your blood pressure under control, and if one of the following is true: °· You are 18-59 years of age and your systolic blood pressure is above 140. °· You are 60 years of age or older, and your systolic blood pressure is above 150. °· Your diastolic blood pressure is above 90. °· You have diabetes, and your systolic blood pressure is over 140 or your diastolic blood pressure is over 90. °· You have kidney disease and your blood pressure is above 140/90. °· You have   heart disease and your blood pressure is above 140/90. °Your personal target blood pressure may vary depending on your medical conditions, your age, and other factors. °HOME CARE INSTRUCTIONS °· Have your blood pressure rechecked as directed by your health care provider.   °· Take medicines only as directed by your health care provider. Follow the  directions carefully. Blood pressure medicines must be taken as prescribed. The medicine does not work as well when you skip doses. Skipping doses also puts you at risk for problems. °· Do not smoke.   °· Monitor your blood pressure at home as directed by your health care provider.  °SEEK MEDICAL CARE IF:  °· You think you are having a reaction to medicines taken. °· You have recurrent headaches or feel dizzy. °· You have swelling in your ankles. °· You have trouble with your vision. °SEEK IMMEDIATE MEDICAL CARE IF: °· You develop a severe headache or confusion. °· You have unusual weakness, numbness, or feel faint. °· You have severe chest or abdominal pain. °· You vomit repeatedly. °· You have trouble breathing. °MAKE SURE YOU:  °· Understand these instructions. °· Will watch your condition. °· Will get help right away if you are not doing well or get worse. °  °This information is not intended to replace advice given to you by your health care provider. Make sure you discuss any questions you have with your health care provider. °  °Document Released: 04/22/2005 Document Revised: 09/06/2014 Document Reviewed: 02/12/2013 °Elsevier Interactive Patient Education ©2016 [employer] ° °

## 2015-04-29 NOTE — ED Notes (Signed)
On way back to treatment room, pt states 'you know ever since i found out my blood pressure has been high i think i've felt weak, i don't know, maybe the medicine isn't working." pt moving all extremities without difficulty.

## 2015-08-23 ENCOUNTER — Encounter (INDEPENDENT_AMBULATORY_CARE_PROVIDER_SITE_OTHER): Payer: Medicare Other | Admitting: Ophthalmology

## 2015-08-23 DIAGNOSIS — H353121 Nonexudative age-related macular degeneration, left eye, early dry stage: Secondary | ICD-10-CM | POA: Diagnosis not present

## 2015-08-23 DIAGNOSIS — I10 Essential (primary) hypertension: Secondary | ICD-10-CM | POA: Diagnosis not present

## 2015-08-23 DIAGNOSIS — H353114 Nonexudative age-related macular degeneration, right eye, advanced atrophic with subfoveal involvement: Secondary | ICD-10-CM | POA: Diagnosis not present

## 2015-08-23 DIAGNOSIS — H35033 Hypertensive retinopathy, bilateral: Secondary | ICD-10-CM | POA: Diagnosis not present

## 2015-08-23 DIAGNOSIS — H43813 Vitreous degeneration, bilateral: Secondary | ICD-10-CM | POA: Diagnosis not present

## 2016-02-22 ENCOUNTER — Ambulatory Visit (INDEPENDENT_AMBULATORY_CARE_PROVIDER_SITE_OTHER): Payer: Medicare Other | Admitting: Ophthalmology

## 2016-02-27 ENCOUNTER — Ambulatory Visit (INDEPENDENT_AMBULATORY_CARE_PROVIDER_SITE_OTHER): Payer: Medicare Other | Admitting: Ophthalmology

## 2016-02-29 ENCOUNTER — Ambulatory Visit (INDEPENDENT_AMBULATORY_CARE_PROVIDER_SITE_OTHER): Payer: Medicare Other | Admitting: Ophthalmology

## 2016-03-06 ENCOUNTER — Ambulatory Visit (INDEPENDENT_AMBULATORY_CARE_PROVIDER_SITE_OTHER): Payer: Medicare Other | Admitting: Ophthalmology

## 2016-03-27 ENCOUNTER — Ambulatory Visit (INDEPENDENT_AMBULATORY_CARE_PROVIDER_SITE_OTHER): Payer: Medicare Other | Admitting: Ophthalmology

## 2016-05-14 ENCOUNTER — Ambulatory Visit (INDEPENDENT_AMBULATORY_CARE_PROVIDER_SITE_OTHER): Payer: Medicare Other | Admitting: Ophthalmology

## 2016-06-25 ENCOUNTER — Ambulatory Visit (INDEPENDENT_AMBULATORY_CARE_PROVIDER_SITE_OTHER): Payer: Medicare Other | Admitting: Ophthalmology

## 2016-07-23 ENCOUNTER — Ambulatory Visit (INDEPENDENT_AMBULATORY_CARE_PROVIDER_SITE_OTHER): Payer: Medicare Other | Admitting: Ophthalmology

## 2016-07-23 DIAGNOSIS — H43813 Vitreous degeneration, bilateral: Secondary | ICD-10-CM | POA: Diagnosis not present

## 2016-07-23 DIAGNOSIS — I10 Essential (primary) hypertension: Secondary | ICD-10-CM | POA: Diagnosis not present

## 2016-07-23 DIAGNOSIS — H353114 Nonexudative age-related macular degeneration, right eye, advanced atrophic with subfoveal involvement: Secondary | ICD-10-CM | POA: Diagnosis not present

## 2016-07-23 DIAGNOSIS — H353221 Exudative age-related macular degeneration, left eye, with active choroidal neovascularization: Secondary | ICD-10-CM

## 2016-07-23 DIAGNOSIS — H35033 Hypertensive retinopathy, bilateral: Secondary | ICD-10-CM | POA: Diagnosis not present

## 2016-11-06 ENCOUNTER — Encounter: Payer: Self-pay | Admitting: Emergency Medicine

## 2016-11-06 ENCOUNTER — Emergency Department
Admission: EM | Admit: 2016-11-06 | Discharge: 2016-11-07 | Disposition: A | Discharge status: Home / Self Care | Payer: Medicare Other | Attending: Emergency Medicine | Admitting: Emergency Medicine

## 2016-11-06 DIAGNOSIS — Z79899 Other long term (current) drug therapy: Secondary | ICD-10-CM | POA: Insufficient documentation

## 2016-11-06 DIAGNOSIS — I10 Essential (primary) hypertension: Secondary | ICD-10-CM | POA: Diagnosis not present

## 2016-11-06 NOTE — ED Triage Notes (Signed)
Pt comes into the ED via POV c/o hypertension.  Patient already takes two HTN medications and was started on another one Monday.  Patient had high readings with 219/108 at home.  Patient denies any chest pain, shortness of breath, or dizziness.  Patient states "I just want to get my medication right".  Patient saw her cardiologist Dr. Park BreedKahn on Monday with no problems except the change in medication. Patient alert and oriented and ambulatory to triage with even and unlabored respirations.

## 2016-11-07 ENCOUNTER — Emergency Department: Payer: Medicare Other

## 2016-11-07 DIAGNOSIS — I10 Essential (primary) hypertension: Secondary | ICD-10-CM | POA: Diagnosis not present

## 2016-11-07 LAB — CBC WITH DIFFERENTIAL/PLATELET
BASOS ABS: 0.1 10*3/uL (ref 0–0.1)
BASOS PCT: 1 %
EOS ABS: 0.1 10*3/uL (ref 0–0.7)
Eosinophils Relative: 2 %
HCT: 47.3 % — ABNORMAL HIGH (ref 35.0–47.0)
Hemoglobin: 16 g/dL (ref 12.0–16.0)
LYMPHS PCT: 24 %
Lymphs Abs: 1.9 10*3/uL (ref 1.0–3.6)
MCH: 29.1 pg (ref 26.0–34.0)
MCHC: 33.9 g/dL (ref 32.0–36.0)
MCV: 86 fL (ref 80.0–100.0)
Monocytes Absolute: 0.4 10*3/uL (ref 0.2–0.9)
Monocytes Relative: 6 %
Neutro Abs: 5.4 10*3/uL (ref 1.4–6.5)
Neutrophils Relative %: 67 %
Platelets: 302 10*3/uL (ref 150–440)
RBC: 5.5 MIL/uL — AB (ref 3.80–5.20)
RDW: 14.2 % (ref 11.5–14.5)
WBC: 8 10*3/uL (ref 3.6–11.0)

## 2016-11-07 LAB — BASIC METABOLIC PANEL
ANION GAP: 8 (ref 5–15)
BUN: 18 mg/dL (ref 6–20)
CO2: 28 mmol/L (ref 22–32)
Calcium: 9.9 mg/dL (ref 8.9–10.3)
Chloride: 105 mmol/L (ref 101–111)
Creatinine, Ser: 1.09 mg/dL — ABNORMAL HIGH (ref 0.44–1.00)
GFR, EST AFRICAN AMERICAN: 50 mL/min — AB (ref >60)
GFR, EST NON AFRICAN AMERICAN: 43 mL/min — AB (ref >60)
Glucose, Bld: 102 mg/dL — ABNORMAL HIGH (ref 65–99)
POTASSIUM: 3.5 mmol/L (ref 3.5–5.1)
SODIUM: 141 mmol/L (ref 135–145)

## 2016-11-07 LAB — URINALYSIS, COMPLETE (UACMP) WITH MICROSCOPIC
BILIRUBIN URINE: NEGATIVE
Bacteria, UA: NONE SEEN
GLUCOSE, UA: NEGATIVE mg/dL
KETONES UR: NEGATIVE mg/dL
LEUKOCYTES UA: NEGATIVE
NITRITE: NEGATIVE
Protein, ur: NEGATIVE mg/dL
SQUAMOUS EPITHELIAL / LPF: NONE SEEN
Specific Gravity, Urine: 1.003 — ABNORMAL LOW (ref 1.005–1.030)
pH: 7 (ref 5.0–8.0)

## 2016-11-07 LAB — TROPONIN I

## 2016-11-07 MED ORDER — HYDRALAZINE HCL 20 MG/ML IJ SOLN
5.0000 mg | Freq: Once | INTRAMUSCULAR | Status: AC
  Administered 2016-11-07: 5 mg via INTRAVENOUS
  Filled 2016-11-07: qty 1

## 2016-11-07 NOTE — Discharge Instructions (Signed)
1. Please keep a log of your blood pressure readings 3 times daily. 2. Continue medicines as directed by your doctor. 3. Return to the ER for worsening symptoms, persistent vomiting, difficulty breathing or other concerns.

## 2016-11-07 NOTE — ED Provider Notes (Signed)
[facility] Provider Note   ____________________________________________   First MD Initiated Contact with Patient 11/07/16 0007     (approximate)  I have reviewed the triage vital signs and the nursing notes.   HISTORY  Chief Complaint Hypertension    HPI Mandy George is a 83 y.o. female who presents to the ED from home with a chief complaint of elevated blood pressure. Patient reports she has been on metoprololfor a number of years with baseline BP 140s/80s. Saw her PCP 2 weeks ago and started on unknown agent for elevated BP 200s systolic in the office. However, patient had facial flushing with that agent so her PCP took her off on Monday and replaced with hydralazine and spironolactone. Patient denies symptoms tonight; states she checks her blood pressure nightly and found it to be 200s/90s. Denies fever, chills, chest pain, shortness of breath, abdominal pain, nausea, vomiting, dysuria, headache, vision changes. Denies recent travel or trauma.   Past Medical History:  Diagnosis Date  . Diverticulitis   . Hypercholesteremia   . Hypertension   . Pancreatitis   . Thyroid activity decreased     There are no active problems to display for this patient.   Past Surgical History:  Procedure Laterality Date  . TONSILLECTOMY      Prior to Admission medications   Medication Sig Start Date End Date Taking? Authorizing Provider  furosemide (LASIX) 20 MG tablet Take 20 mg by mouth daily. 02/02/15   [provider]  hydrocortisone (ANUSOL-HC) 25 MG suppository Place 1 suppository (25 mg total) rectally 2 (two) times daily. 02/15/15   Ida Rogue, MD  levothyroxine (SYNTHROID, LEVOTHROID) 88 MCG tablet Take 1 tablet by mouth every morning. 01/03/15   [provider]  losartan (COZAAR) 50 MG tablet Take 50 mg by mouth daily. 12/17/14   [provider]  metoprolol succinate (TOPROL-XL) 25 MG 24 hr  tablet Take 25 mg by mouth daily. 12/12/14   [provider]  potassium chloride 20 MEQ/15ML (10%) SOLN Take 1 mEq by mouth 1 day or 1 dose. 02/03/15   [provider]  simvastatin (ZOCOR) 20 MG tablet Take 20 mg by mouth daily. 01/17/15   [provider]    Allergies Ivp dye [iodinated diagnostic agents]; Eggs or egg-derived products; Mushroom extract complex; and Shrimp [shellfish allergy]  Family History  Problem Relation Age of Onset  . Thyroid disease Mother   . Hypertension Mother   . Hyperlipidemia Sister   . Hyperlipidemia Brother     Social History Social History  Substance Use Topics  . Smoking status: Never Smoker  . Smokeless tobacco: Never Used  . Alcohol use Yes     Comment: occassional    Review of Systems  Constitutional: Positive for elevated blood pressure. No fever/chills. Eyes: No visual changes. ENT: No sore throat. Cardiovascular: Denies chest pain. Respiratory: Denies shortness of breath. Gastrointestinal: No abdominal pain.  No nausea, no vomiting.  No diarrhea.  No constipation. Genitourinary: Negative for dysuria. Musculoskeletal: Negative for back pain. Skin: Negative for rash. Neurological: Negative for headaches, focal weakness or numbness.   ____________________________________________   PHYSICAL EXAM:  VITAL SIGNS: ED Triage Vitals  Enc Vitals Group     BP 11/06/16 2216 (!) 218/96     Pulse Rate 11/06/16 2216 81     Resp 11/06/16 2216 20     Temp 11/06/16 2216 98.2 F (36.8 C)     Temp Source 11/06/16 2216 Oral  SpO2 11/06/16 2216 98 %     Weight 11/06/16 2212 114 lb (51.7 kg)     Height 11/06/16 2212 5\' 1"  (1.549 m)     Head Circumference --      Peak Flow --      Pain Score --      Pain Loc --      Pain Edu? --      Excl. in GC? --     Constitutional: Alert and oriented. Well appearing and in no acute distress. Eyes: Conjunctivae are normal. PERRL. EOMI. Head: Atraumatic. Nose: No  congestion/rhinnorhea. Mouth/Throat: Mucous membranes are moist.  Oropharynx non-erythematous. Neck: No stridor.  Supple neck without meningismus.  No carotid bruits. Cardiovascular: Normal rate, regular rhythm. Grossly normal heart sounds.  Good peripheral circulation. Respiratory: Normal respiratory effort.  No retractions. Lungs CTAB. Gastrointestinal: Soft and nontender. No distention. No abdominal bruits. No CVA tenderness. Musculoskeletal: No lower extremity tenderness nor edema.  No joint effusions. Neurologic:  Normal speech and language. No gross focal neurologic deficits are appreciated. No gait instability. Skin:  Skin is warm, dry and intact. No rash noted. Psychiatric: Mood and affect are normal. Speech and behavior are normal.  ____________________________________________   LABS (all labs ordered are listed, but only abnormal results are displayed)  Labs Reviewed  CBC WITH DIFFERENTIAL/PLATELET - Abnormal; Notable for the following:       Result Value   RBC 5.50 (*)    HCT 47.3 (*)    All other components within normal limits  BASIC METABOLIC PANEL - Abnormal; Notable for the following:    Glucose, Bld 102 (*)    Creatinine, Ser 1.09 (*)    GFR calc non Af Amer 43 (*)    GFR calc Af Amer 50 (*)    All other components within normal limits  URINALYSIS, COMPLETE (UACMP) WITH MICROSCOPIC - Abnormal; Notable for the following:    Color, Urine COLORLESS (*)    APPearance CLEAR (*)    Specific Gravity, Urine 1.003 (*)    Hgb urine dipstick SMALL (*)    All other components within normal limits  TROPONIN I   ____________________________________________  EKG  ED ECG REPORT I, Kaizley Aja J, the attending physician, personally viewed and interpreted this ECG.   Date: 11/07/2016  EKG Time: 0135  Rate: 98  Rhythm: normal EKG, normal sinus rhythm  Axis: Normal  Intervals:first-degree A-V block   ST&T Change:  Nonspecific  ____________________________________________  RADIOLOGY  Dg Chest 2 View  Result Date: 11/07/2016 CLINICAL DATA:  Hypertension. EXAM: CHEST  2 VIEW COMPARISON:  Radiographs 07/25/2013 FINDINGS: The cardiomediastinal contours are normal. There is atherosclerosis of the thoracic aorta. Pulmonary vasculature is normal. No consolidation, pleural effusion, or pneumothorax. Scattered calcified granuloma with calcified mediastinal nodes, sequela of prior granulomatous disease. No acute osseous abnormalities are seen. IMPRESSION: 1. No acute abnormality. 2. Thoracic aortic atherosclerosis. Electronically Signed   By: Rubye Oaks M.D.   On: 11/07/2016 01:19    ____________________________________________   PROCEDURES  Procedure(s) performed: None  Procedures  Critical Care performed: No  ____________________________________________   INITIAL IMPRESSION / ASSESSMENT AND PLAN / ED COURSE  Pertinent labs & imaging results that were available during my care of the patient were reviewed by me and considered in my medical decision making (see chart for details).  83 year old female with hypertension who was recently started on 2 additional agents to control blood pressure. Presents with asymptomatic hypertension. Will obtain screening lab work, EKG, urinalysis. Will administer  IV hydralazine and reassess.  Clinical Course as of Nov 07 516  Thu Nov 07, 2016  0223 Patient resting in no acute distress. BP 150/91. Encouraged patient to keep a blood pressure log and to phone her PCP in the morning for follow-up. Strict return precautions given. Patient and family member verbalize understanding and agree with plan of care.  [JS]    Clinical Course User Index [JS] Irean HongSung, Noam Franzen J, MD     ____________________________________________   FINAL CLINICAL IMPRESSION(S) / ED DIAGNOSES  Final diagnoses:  Essential hypertension      NEW MEDICATIONS STARTED DURING THIS  VISIT:  Discharge Medication List as of 11/07/2016  2:25 AM       Note:  This document was prepared using Dragon voice recognition software and may include unintentional dictation errors.    Irean HongSung, Karris Deangelo J, MD 11/07/16 [phone]

## 2016-11-07 NOTE — ED Notes (Signed)
Pt resting in bed at this time. This RN apologized for the delay. Pt and family state understanding at this time. Pt states she has to go to the bathroom again. This RN explained would allow pt to go to the bathroom before rehooking her back up. Pt remains alert and oriented at this time, NAD noted.

## 2016-11-07 NOTE — ED Notes (Signed)
Pt up to toilet 

## 2017-04-22 IMAGING — CT CT ABD-PELV W/ CM
1 of 3 series · 14 of 32 positions shown, 19 images · IV contrast (omnipaque)
Comparison: None.

CLINICAL DATA: Nausea 4 days with lower abdominal pain today and
tender abdomen on exam.

EXAM:
CT ABDOMEN AND PELVIS WITH CONTRAST
TECHNIQUE: Multidetector CT imaging of the abdomen and pelvis was performed
using the standard protocol following bolus administration of
intravenous contrast.
CONTRAST:  75mL OMNIPAQUE IOHEXOL 300 MG/ML  SOLN

[Series 2: routine abd pel with · axial · 0.69mm/px · z∈[-316,-2]mm · 14 of 71 slices shown, 19 images]
[im 4/71  soft-tissue]
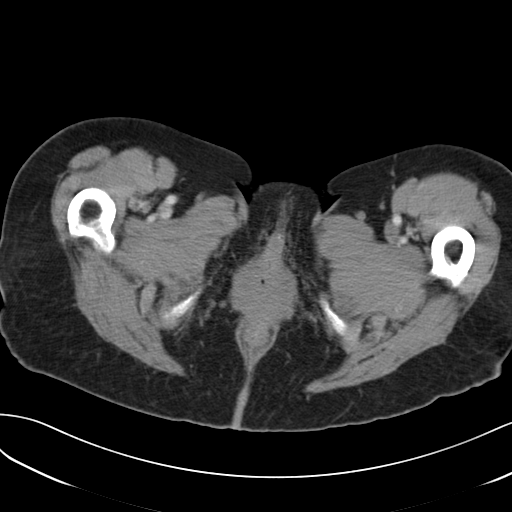
[im 4/71  bone]
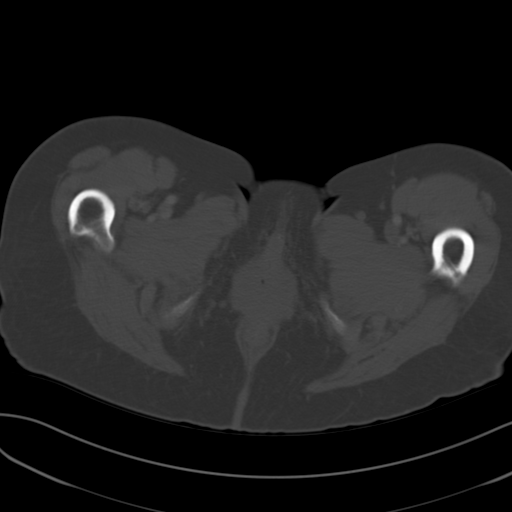
[im 12/71  soft-tissue]
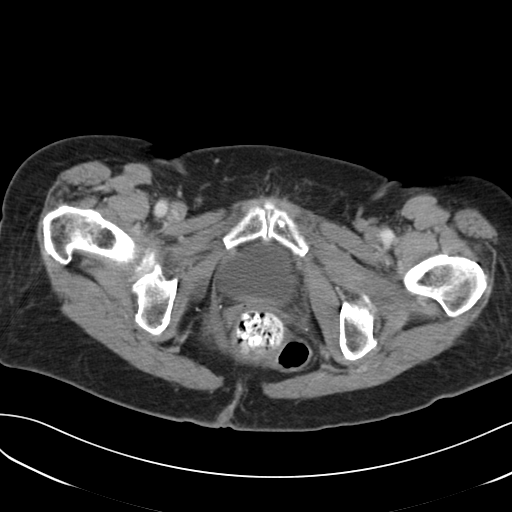
[im 15/71  soft-tissue]
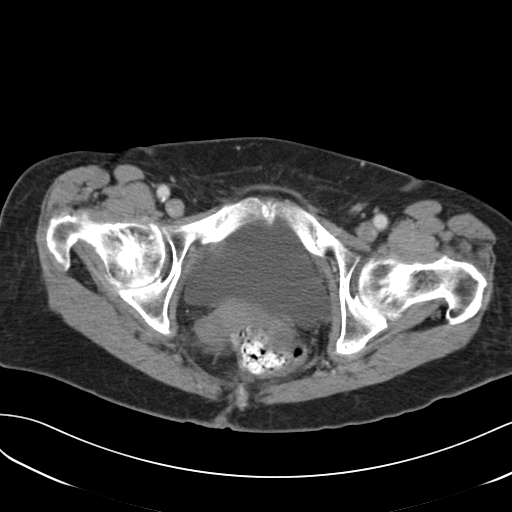
[im 19/71  soft-tissue]
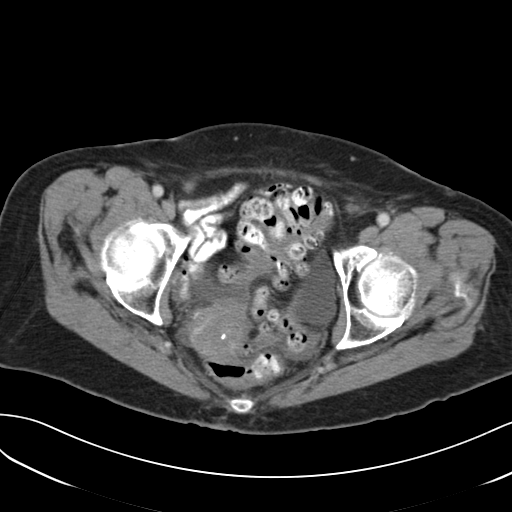
[im 26/71  soft-tissue]
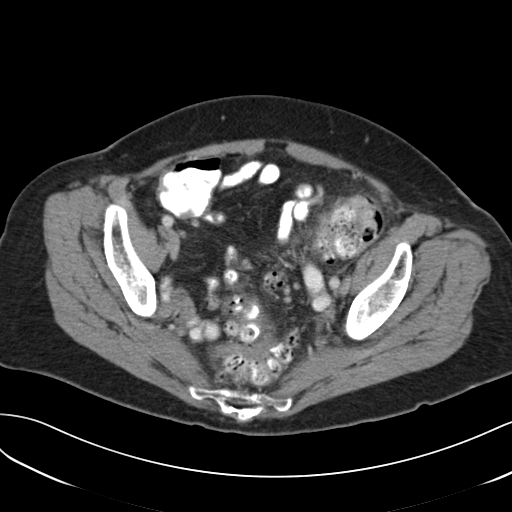
[im 30/71  soft-tissue]
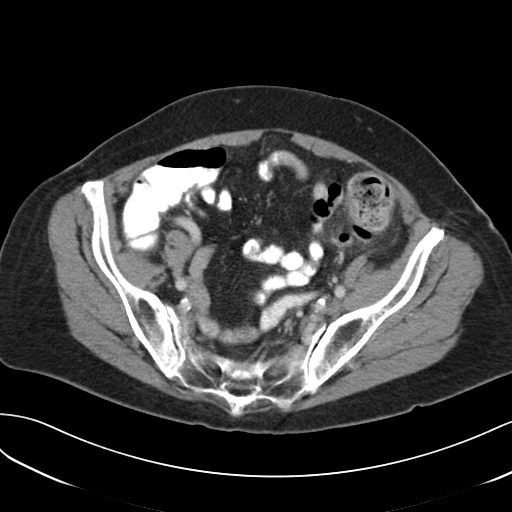
[im 37/71  soft-tissue]
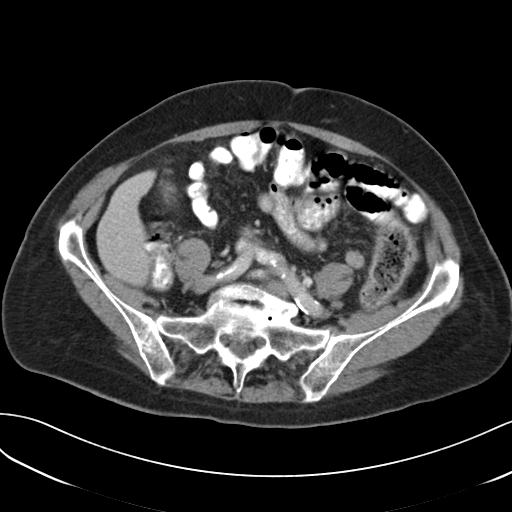
[im 41/71  soft-tissue]
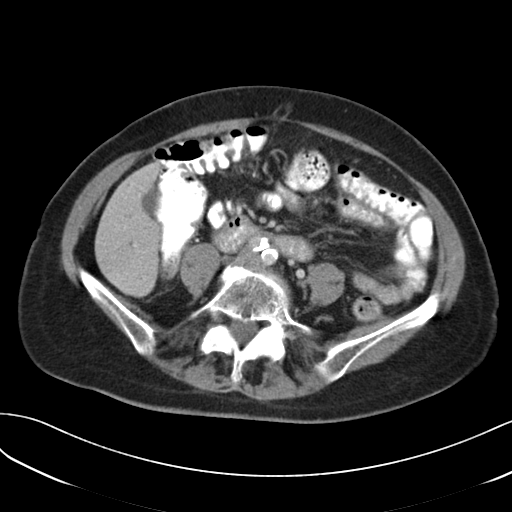
[im 45/71  soft-tissue]
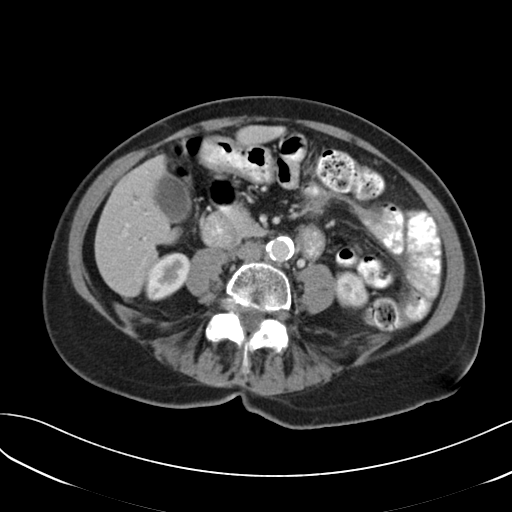
[im 45/71  bone]
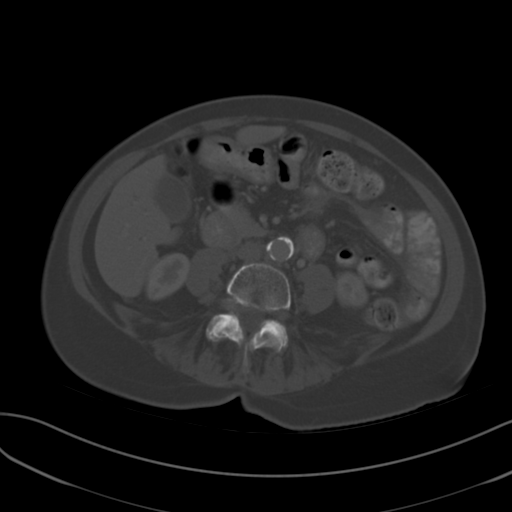
[im 52/71  soft-tissue]
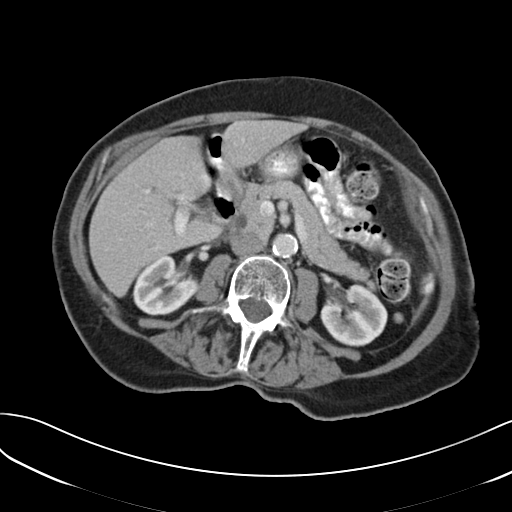
[im 56/71  soft-tissue]
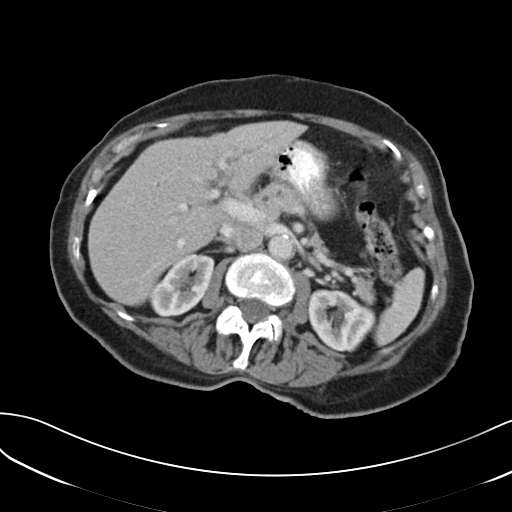
[im 56/71  lung]
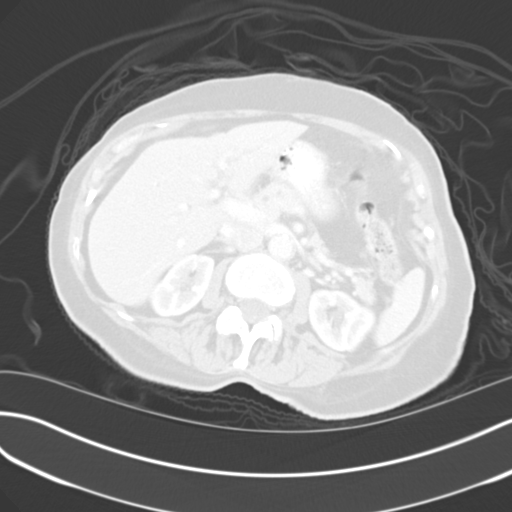
[im 59/71  soft-tissue]
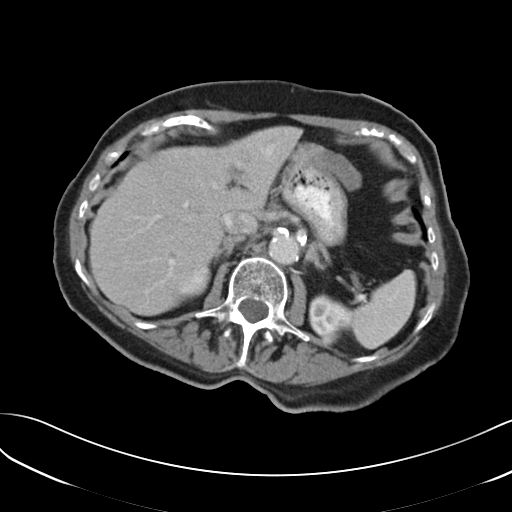
[im 59/71  lung]
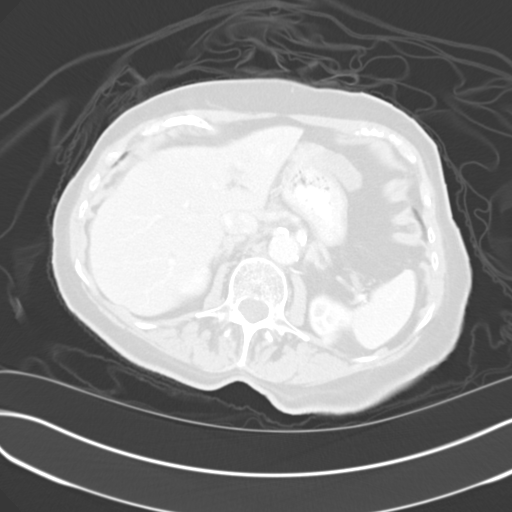
[im 63/71  lung]
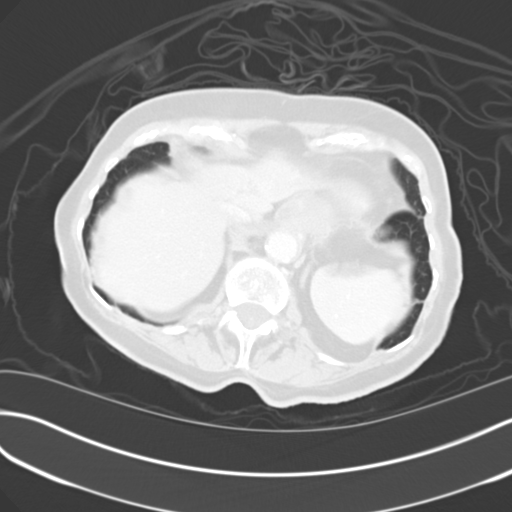
[im 67/71  soft-tissue]
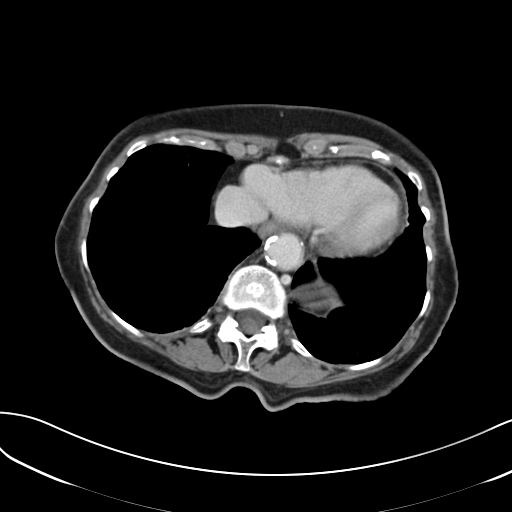
[im 67/71  lung]
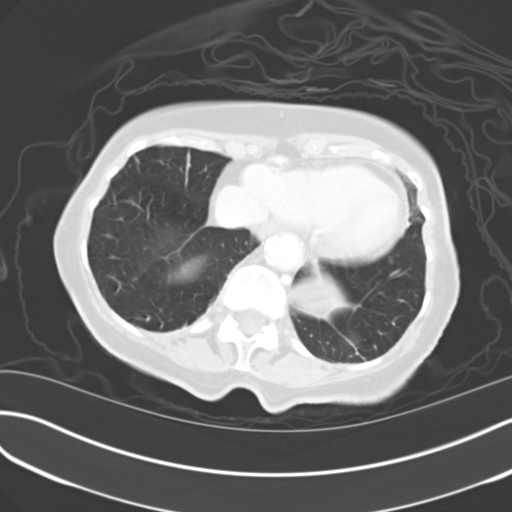

[14 of 32 positions shown; findings below may reference images not displayed]

FINDINGS: Lung bases are within normal.

Abdominal images demonstrate a few tiny sub cm hypodensities within
the liver likely cysts. 3.4 cm hypodensity over the lateral segment
of the left lobe of the liver unchanged on the delayed images and
likely a minimally complicated cyst. Calcified granuloma over the
spleen. Possible pancreatic divisum is present.

The gallbladder and adrenal glands are normal. Kidneys are normal in
size with a couple tiny sub cm renal cortical hypodensities likely
cysts, but too small to characterize. Ureters are within normal.

Small bowel is within normal. Appendix is normal. There is mild
inflammatory change adjacent the sigmoid colon in the left lower
quadrant with minimal free fluid in the left pericolic gutter.
Moderate diverticulosis of the sigmoid colon. These findings likely
due to mild acute diverticulitis. No evidence of perforation or
abscess. No evidence of focal mass or adenopathy.

There is moderate calcified plaque over the abdominal aorta and
iliac arteries.

Pelvic images demonstrate the bladder, rectum, uterus and adnexa to
be within normal.

There mild degenerate changes of the spinal multilevel disc disease
over the lumbar spine. Mild degenerate change of the hips.
IMPRESSION: Evidence of mild acute diverticulitis over the left lower quadrant
involving the sigmoid colon. No perforation or abscess.

3.4 cm hypodensity over the left lobe of the liver likely minimally
complicated cyst. Few other smaller subcentimeter liver
hypodensities likely cysts.

Few small sub cm renal cortical hypodensities too small to
characterize, but likely cysts.

## 2017-04-24 ENCOUNTER — Ambulatory Visit (INDEPENDENT_AMBULATORY_CARE_PROVIDER_SITE_OTHER): Payer: Medicare Other | Admitting: Ophthalmology

## 2017-05-27 ENCOUNTER — Encounter (INDEPENDENT_AMBULATORY_CARE_PROVIDER_SITE_OTHER): Payer: Medicare Other | Admitting: Ophthalmology

## 2017-05-27 DIAGNOSIS — H43813 Vitreous degeneration, bilateral: Secondary | ICD-10-CM

## 2017-05-27 DIAGNOSIS — H35033 Hypertensive retinopathy, bilateral: Secondary | ICD-10-CM | POA: Diagnosis not present

## 2017-05-27 DIAGNOSIS — H353221 Exudative age-related macular degeneration, left eye, with active choroidal neovascularization: Secondary | ICD-10-CM

## 2017-05-27 DIAGNOSIS — I10 Essential (primary) hypertension: Secondary | ICD-10-CM

## 2017-05-27 DIAGNOSIS — H353114 Nonexudative age-related macular degeneration, right eye, advanced atrophic with subfoveal involvement: Secondary | ICD-10-CM

## 2017-08-11 ENCOUNTER — Inpatient Hospital Stay
Admission: EM | Admit: 2017-08-11 | Discharge: 2017-08-13 | DRG: 641 | Disposition: A | Discharge status: Home / Self Care | Payer: Medicare Other | Attending: Internal Medicine | Admitting: Internal Medicine

## 2017-08-11 ENCOUNTER — Emergency Department: Payer: Medicare Other

## 2017-08-11 ENCOUNTER — Other Ambulatory Visit: Payer: Self-pay

## 2017-08-11 DIAGNOSIS — Z91018 Allergy to other foods: Secondary | ICD-10-CM

## 2017-08-11 DIAGNOSIS — Z91041 Radiographic dye allergy status: Secondary | ICD-10-CM

## 2017-08-11 DIAGNOSIS — T502X5A Adverse effect of carbonic-anhydrase inhibitors, benzothiadiazides and other diuretics, initial encounter: Secondary | ICD-10-CM | POA: Diagnosis present

## 2017-08-11 DIAGNOSIS — Z91013 Allergy to seafood: Secondary | ICD-10-CM

## 2017-08-11 DIAGNOSIS — E871 Hypo-osmolality and hyponatremia: Secondary | ICD-10-CM | POA: Diagnosis not present

## 2017-08-11 DIAGNOSIS — Z79890 Hormone replacement therapy (postmenopausal): Secondary | ICD-10-CM

## 2017-08-11 DIAGNOSIS — Z91012 Allergy to eggs: Secondary | ICD-10-CM

## 2017-08-11 DIAGNOSIS — H9190 Unspecified hearing loss, unspecified ear: Secondary | ICD-10-CM | POA: Diagnosis present

## 2017-08-11 DIAGNOSIS — Z66 Do not resuscitate: Secondary | ICD-10-CM | POA: Diagnosis present

## 2017-08-11 DIAGNOSIS — Z79899 Other long term (current) drug therapy: Secondary | ICD-10-CM

## 2017-08-11 DIAGNOSIS — E039 Hypothyroidism, unspecified: Secondary | ICD-10-CM | POA: Diagnosis present

## 2017-08-11 DIAGNOSIS — I10 Essential (primary) hypertension: Secondary | ICD-10-CM | POA: Diagnosis present

## 2017-08-11 DIAGNOSIS — E7800 Pure hypercholesterolemia, unspecified: Secondary | ICD-10-CM | POA: Diagnosis present

## 2017-08-11 DIAGNOSIS — R202 Paresthesia of skin: Secondary | ICD-10-CM

## 2017-08-11 LAB — PROTIME-INR
INR: 0.91
PROTHROMBIN TIME: 12.2 s (ref 11.4–15.2)

## 2017-08-11 LAB — COMPREHENSIVE METABOLIC PANEL
ALBUMIN: 4.2 g/dL (ref 3.5–5.0)
ALK PHOS: 83 U/L (ref 38–126)
ALT: 13 U/L — AB (ref 14–54)
AST: 28 U/L (ref 15–41)
Anion gap: 9 (ref 5–15)
BUN: 18 mg/dL (ref 6–20)
CALCIUM: 9.4 mg/dL (ref 8.9–10.3)
CO2: 27 mmol/L (ref 22–32)
CREATININE: 0.91 mg/dL (ref 0.44–1.00)
Chloride: 90 mmol/L — ABNORMAL LOW (ref 101–111)
GFR calc non Af Amer: 54 mL/min — ABNORMAL LOW (ref >60)
GLUCOSE: 103 mg/dL — AB (ref 65–99)
Potassium: 3.8 mmol/L (ref 3.5–5.1)
SODIUM: 126 mmol/L — AB (ref 135–145)
Total Bilirubin: 0.8 mg/dL (ref 0.3–1.2)
Total Protein: 7.3 g/dL (ref 6.5–8.1)

## 2017-08-11 LAB — TSH: TSH: 0.654 u[IU]/mL (ref 0.350–4.500)

## 2017-08-11 LAB — DIFFERENTIAL
Basophils Absolute: 0.1 10*3/uL (ref 0–0.1)
Basophils Relative: 1 %
Eosinophils Absolute: 0.2 10*3/uL (ref 0–0.7)
Eosinophils Relative: 3 %
LYMPHS PCT: 37 %
Lymphs Abs: 2 10*3/uL (ref 1.0–3.6)
MONO ABS: 0.5 10*3/uL (ref 0.2–0.9)
Monocytes Relative: 9 %
NEUTROS ABS: 2.7 10*3/uL (ref 1.4–6.5)
Neutrophils Relative %: 50 %

## 2017-08-11 LAB — CBC
HCT: 42.2 % (ref 35.0–47.0)
Hemoglobin: 14.1 g/dL (ref 12.0–16.0)
MCH: 28.2 pg (ref 26.0–34.0)
MCHC: 33.5 g/dL (ref 32.0–36.0)
MCV: 84.3 fL (ref 80.0–100.0)
PLATELETS: 317 10*3/uL (ref 150–440)
RBC: 5 MIL/uL (ref 3.80–5.20)
RDW: 13.7 % (ref 11.5–14.5)
WBC: 5.4 10*3/uL (ref 3.6–11.0)

## 2017-08-11 LAB — GLUCOSE, CAPILLARY: Glucose-Capillary: 91 mg/dL (ref 65–99)

## 2017-08-11 LAB — TROPONIN I: Troponin I: <0.03 ng/mL (ref <0.03)

## 2017-08-11 LAB — APTT: aPTT: 26 seconds (ref 24–36)

## 2017-08-11 MED ORDER — LEVOTHYROXINE SODIUM 88 MCG PO TABS
88.0000 ug | ORAL_TABLET | Freq: Every day | ORAL | Status: DC
  Administered 2017-08-12 – 2017-08-13 (×2): 88 ug via ORAL
  Filled 2017-08-11 (×2): qty 1

## 2017-08-11 MED ORDER — HYDROCORTISONE ACETATE 25 MG RE SUPP
25.0000 mg | Freq: Two times a day (BID) | RECTAL | Status: DC
  Administered 2017-08-12: 25 mg via RECTAL
  Filled 2017-08-11 (×5): qty 1

## 2017-08-11 MED ORDER — HYDRALAZINE HCL 50 MG PO TABS
50.0000 mg | ORAL_TABLET | Freq: Two times a day (BID) | ORAL | Status: DC
  Filled 2017-08-11: qty 1

## 2017-08-11 MED ORDER — LORATADINE 10 MG PO TABS
10.0000 mg | ORAL_TABLET | Freq: Every day | ORAL | Status: DC
  Administered 2017-08-12 – 2017-08-13 (×2): 10 mg via ORAL
  Filled 2017-08-11 (×2): qty 1

## 2017-08-11 MED ORDER — VITAMIN D (ERGOCALCIFEROL) 1.25 MG (50000 UNIT) PO CAPS: 50000.0000 [IU] | ORAL_CAPSULE | ORAL | Status: DC

## 2017-08-11 MED ORDER — SODIUM CHLORIDE 0.9 % IV SOLN
INTRAVENOUS | Status: DC
  Administered 2017-08-11 – 2017-08-13 (×3): via INTRAVENOUS

## 2017-08-11 MED ORDER — DOCUSATE SODIUM 100 MG PO CAPS: 100.0000 mg | ORAL_CAPSULE | Freq: Two times a day (BID) | ORAL | Status: DC | PRN

## 2017-08-11 MED ORDER — HEPARIN SODIUM (PORCINE) 5000 UNIT/ML IJ SOLN
5000.0000 [IU] | Freq: Three times a day (TID) | INTRAMUSCULAR | Status: DC
  Administered 2017-08-11 – 2017-08-13 (×4): 5000 [IU] via SUBCUTANEOUS
  Filled 2017-08-11 (×4): qty 1

## 2017-08-11 MED ORDER — METOPROLOL SUCCINATE ER 25 MG PO TB24
25.0000 mg | ORAL_TABLET | Freq: Every day | ORAL | Status: DC
  Administered 2017-08-12 – 2017-08-13 (×2): 25 mg via ORAL
  Filled 2017-08-11 (×3): qty 1

## 2017-08-11 MED ORDER — ONDANSETRON HCL 4 MG/2ML IJ SOLN
4.0000 mg | Freq: Once | INTRAMUSCULAR | Status: AC
  Administered 2017-08-11: 4 mg via INTRAVENOUS
  Filled 2017-08-11: qty 2

## 2017-08-11 MED ORDER — LOSARTAN POTASSIUM 50 MG PO TABS: 100.0000 mg | ORAL_TABLET | Freq: Every day | ORAL | Status: DC

## 2017-08-11 NOTE — H&P (Signed)
[employer] [facility]   PATIENT NAME: Mandy George    MR#:  [id number]  DATE OF BIRTH:  [birth year]  DATE OF ADMISSION:  08/11/2017  PRIMARY CARE PHYSICIAN: Margaretann Loveless, MD   REQUESTING/REFERRING PHYSICIAN: Roxan Hockey  CHIEF COMPLAINT:  No chief complaint on file.   HISTORY OF PRESENT ILLNESS: Mandy George  is a 84 y.o. female with a known history of diverticulitis, hypercholesterolemia, hypertension, thyroid disease thought to emergency room by her daughter and son who lives with her. Patient is hard of hearing and not a very good historian so all history obtained from her daughter in the room.  For last few weeks patient has on and off symptoms of feeling dizzy, weak, tremors and numbness and burning sensation in her legs. This symptoms are on and off and sometimes she will also have word finding difficulties and confusion. At her baseline she was able to get up and walk and take care of herself, but last few weeks she appears more weak as per family. She also complained of bloating and gas. Concerned with this family decided to bring her to emergency room, on initial workup CT head was negative, CT abdomen was also done for her abdominal complaints which was negative also. Her sodium was noted to be low and so she is given for admission.  PAST MEDICAL HISTORY:   Past Medical History:  Diagnosis Date  . Diverticulitis   . Hypercholesteremia   . Hypertension   . Pancreatitis   . Thyroid activity decreased     PAST SURGICAL HISTORY:  Past Surgical History:  Procedure Laterality Date  . TONSILLECTOMY      SOCIAL HISTORY:  Social History   Tobacco Use  . Smoking status: Never Smoker  . Smokeless tobacco: Never Used  Substance Use Topics  . Alcohol use: Yes    Comment: occassional    FAMILY HISTORY:  Family History  Problem Relation Age of Onset  . Thyroid disease Mother   . Hypertension Mother   . Hyperlipidemia Sister   . Hyperlipidemia  Brother     DRUG ALLERGIES:  Allergies  Allergen Reactions  . Ivp Dye [Iodinated Diagnostic Agents] Photosensitivity  . Eggs Or Egg-Derived Products Swelling  . Mushroom Extract Complex Swelling  . Shrimp [Shellfish Allergy] Swelling    REVIEW OF SYSTEMS:   CONSTITUTIONAL: No fever,positive for fatigue or weakness.  EYES: No blurred or double vision.  EARS, NOSE, AND THROAT: No tinnitus or ear pain.  RESPIRATORY: No cough, shortness of breath, wheezing or hemoptysis.  CARDIOVASCULAR: No chest pain, orthopnea, edema.  GASTROINTESTINAL: No nausea, vomiting, diarrhea or abdominal pain.  GENITOURINARY: No dysuria, hematuria.  ENDOCRINE: No polyuria, nocturia,  HEMATOLOGY: No anemia, easy bruising or bleeding SKIN: No rash or lesion. MUSCULOSKELETAL: No joint pain or arthritis.   NEUROLOGIC: No tingling, numbness, weakness.  PSYCHIATRY: No anxiety or depression.   MEDICATIONS AT HOME:  Prior to Admission medications   Medication Sig Start Date End Date Taking? Authorizing Provider  fexofenadine (ALLEGRA) 180 MG tablet Take 180 mg by mouth daily.   Yes [provider]  hydrALAZINE (APRESOLINE) 50 MG tablet Take 50 mg by mouth 2 (two) times daily. 07/27/17  Yes [provider]  hydrochlorothiazide (HYDRODIURIL) 12.5 MG tablet Take 1 tablet by mouth every Monday, Wednesday, and Friday. 07/01/17  Yes [provider]  levothyroxine (SYNTHROID, LEVOTHROID) 88 MCG tablet Take 1 tablet by mouth every morning. 01/03/15  Yes [provider]  losartan (  COZAAR) 100 MG tablet Take 100 mg by mouth daily. 08/03/17  Yes [provider]  metoprolol succinate (TOPROL-XL) 25 MG 24 hr tablet Take 25 mg by mouth daily. 12/12/14  Yes [provider]  Vitamin D, Ergocalciferol, (DRISDOL) 50000 units CAPS capsule Take 1 capsule by mouth once a week. 07/28/17  Yes [provider]  hydrocortisone (ANUSOL-HC) 25 MG suppository Place 1 suppository (25 mg  total) rectally 2 (two) times daily. Patient not taking: Reported on 08/11/2017 02/15/15   Ida Rogue, MD      PHYSICAL EXAMINATION:   VITAL SIGNS: Height 5\' 7"  (1.702 m), weight (!) 158.8 kg (350 lb).  GENERAL:  84 y.o.-year-old patient lying in the bed with no acute distress.  EYES: Pupils equal, round, reactive to light and accommodation. No scleral icterus. Extraocular muscles intact.  HEENT: Head atraumatic, normocephalic. Oropharynx and nasopharynx clear. Very hard of hearing. NECK:  Supple, no jugular venous distention. No thyroid enlargement, no tenderness.  LUNGS: Normal breath sounds bilaterally, no wheezing, rales,rhonchi or crepitation. No use of accessory muscles of respiration.  CARDIOVASCULAR: S1, S2 normal. No murmurs, rubs, or gallops.  ABDOMEN: Soft, nontender, nondistended. Bowel sounds present. No organomegaly or mass.  EXTREMITIES: No pedal edema, cyanosis, or clubbing.  NEUROLOGIC: Cranial nerves II through XII are intact. Muscle strength 4-5/5 in all extremities. Sensation intact. Gait not checked.  PSYCHIATRIC: The patient is alert and oriented x 3.  SKIN: No obvious rash, lesion, or ulcer.   LABORATORY PANEL:   CBC Recent Labs  Lab 08/11/17 1603  WBC 5.4  HGB 14.1  HCT 42.2  PLT 317  MCV 84.3  MCH 28.2  MCHC 33.5  RDW 13.7  LYMPHSABS 2.0  MONOABS 0.5  EOSABS 0.2  BASOSABS 0.1   ------------------------------------------------------------------------------------------------------------------  Chemistries  Recent Labs  Lab 08/11/17 1603  NA 126*  K 3.8  CL 90*  CO2 27  GLUCOSE 103*  BUN 18  CREATININE 0.91  CALCIUM 9.4  AST 28  ALT 13*  ALKPHOS 83  BILITOT 0.8   ------------------------------------------------------------------------------------------------------------------ estimated creatinine clearance is 63.9 mL/min (by C-G formula based on SCr of 0.91  mg/dL). ------------------------------------------------------------------------------------------------------------------ Recent Labs    08/11/17 1603  TSH 0.654     Coagulation profile Recent Labs  Lab 08/11/17 1603  INR 0.91   ------------------------------------------------------------------------------------------------------------------- No results for input(s): DDIMER in the last 72 hours. -------------------------------------------------------------------------------------------------------------------  Cardiac Enzymes Recent Labs  Lab 08/11/17 1603  TROPONINI <0.03   ------------------------------------------------------------------------------------------------------------------ Invalid input(s): POCBNP  ---------------------------------------------------------------------------------------------------------------  Urinalysis    Component Value Date/Time   COLORURINE COLORLESS (A) 11/07/2016 0120   APPEARANCEUR CLEAR (A) 11/07/2016 0120   LABSPEC 1.003 (L) 11/07/2016 0120   PHURINE 7.0 11/07/2016 0120   GLUCOSEU NEGATIVE 11/07/2016 0120   HGBUR SMALL (A) 11/07/2016 0120   BILIRUBINUR NEGATIVE 11/07/2016 0120   KETONESUR NEGATIVE 11/07/2016 0120   PROTEINUR NEGATIVE 11/07/2016 0120   NITRITE NEGATIVE 11/07/2016 0120   LEUKOCYTESUR NEGATIVE 11/07/2016 0120     RADIOLOGY: Ct Abdomen Pelvis Wo Contrast  Result Date: 08/11/2017 CLINICAL DATA:  84 year old female with intermittent bloating and abdominal distention since early January but worse recently. History of diverticulitis and pancreatitis. Initial encounter. EXAM: CT ABDOMEN AND PELVIS WITHOUT CONTRAST TECHNIQUE: Multidetector CT imaging of the abdomen and pelvis was performed following the standard protocol without IV contrast. COMPARISON:  01/16/2015 CT. FINDINGS: Lower chest: Mild scarring lung bases. Mild pectus deformity. Slight impression upon the heart. Hepatobiliary: Left lobe of liver cyst similar  to prior exam  spanning over 3.6 cm No calcified gallstone. Pancreas: Taking into account limitation by non contrast imaging, no worrisome mass. Spleen: Tiny calcification within the spleen. Taking into account limitation by non contrast imaging, no worrisome splenic lesion or enlargement. Adrenals/Urinary Tract: No obstructing stone or hydronephrosis. Taking into account limitation by non contrast imaging, no worrisome renal or adrenal lesion. Noncontrast filled views of the urinary bladder unremarkable. Stomach/Bowel: Prominent duodenal diverticulum incidentally noted. Prominent sigmoid diverticulosis/muscular hypertrophy without CT evidence of diverticulitis. Vascular/Lymphatic: Atherosclerotic changes abdominal aorta with slight ectasia without aneurysm. Atherosclerotic changes aortic branch vessels. No adenopathy. Reproductive: Uterus tilted to the right. No worrisome adnexal mass. Other: No free air or bowel containing hernia. Musculoskeletal: Mild scoliosis lumbar spine convex left with superimposed degenerative changes L3-4 through L5-S1. IMPRESSION: Prominent sigmoid diverticulosis (with significant associated muscular hypertrophy) without CT evidence diverticulitis. Prominent duodenal diverticulum incidentally noted. Aortic Atherosclerosis (ICD10-I70.0). Relatively similar appearance of cyst within the left lobe liver spanning over 3.6 cm. Electronically Signed   By: Lacy DuverneySteven  Olson M.D.   On: 08/11/2017 17:15   Ct Head Code Stroke Wo Contrast  Result Date: 08/11/2017 CLINICAL DATA:  Code stroke. Intermittent aphasia over the last month with difficulty during the last hour. Focal neuro deficit of less than 6 hours. EXAM: CT HEAD WITHOUT CONTRAST TECHNIQUE: Contiguous axial images were obtained from the base of the skull through the vertex without intravenous contrast. COMPARISON:  CT head without contrast 07/25/2013. FINDINGS: Brain: Atrophy and white matter changes are stable. No acute infarct,  hemorrhage, or mass lesion is present. The ventricles are proportionate to the degree of atrophy. Brainstem and cerebellum are normal. No significant extra-axial fluid collection is present. Vascular: Atherosclerotic calcifications are present within the cavernous internal carotid arteries bilaterally. There is no hyperdense vessel. Skull: Calvarium is intact. No focal lytic or blastic lesions are present. No significant extracranial soft tissue injury is present. Sinuses/Orbits: The paranasal sinuses and mastoid air cells are clear. A right lens replacement is present. Globes and orbits are otherwise within normal limits. ASPECTS [facility]([place] Stroke Program Early CT Score) - Ganglionic level infarction (caudate, lentiform nuclei, internal capsule, insula, M1-M3 cortex): 7/7 - Supraganglionic infarction (M4-M6 cortex): 3/3 Total score (0-10 with 10 being normal): 10/10 IMPRESSION: 1. Stable atrophy and white matter disease. This likely reflects the sequela of chronic microvascular ischemia. 2. No acute intracranial abnormality. 3. ASPECTS is 10/10 These results were called by telephone at the time of interpretation on 08/11/2017 at 3:51 pm to Dr. Merrily BrittleNEIL RIFENBARK , who verbally acknowledged these results. Electronically Signed   By: Marin Robertshristopher  Mattern M.D.   On: 08/11/2017 15:53    EKG: Orders placed or performed during the hospital encounter of 08/11/17  . ED EKG  . ED EKG    IMPRESSION AND PLAN:  * hyponatremia   Likely this is secondary to diuretics use, stopped diuretics.   IV fluids for now.   Checked TSH, normal.  * hypertension   Continue medications except for her thiazide.  * hypothyroidism   Continue levothyroxine.  * gas and bloating   CT abdomen was done which is negative.   I had spoke to the family to try lactose-free diet.  All the records are reviewed and case discussed with ED provider. Management plans discussed with the patient, family and they are in agreement.  CODE  STATUS: Full.  TOTAL TIME TAKING CARE OF THIS PATIENT: 35 minutes.    Altamese DillingVaibhavkumar Suesan Mohrmann M.D on 08/11/2017   Between 7am to 6pm -  Pager - [phone]  After 6pm go to [email or web address] - password [email or web address]  [facility]  Office  [phone]  CC: Primary care physician; Margaretann Loveless, MD   Note: This dictation was prepared with Dragon dictation along with smaller phrase technology. Any transcriptional errors that result from this process are unintentional.

## 2017-08-11 NOTE — ED Notes (Signed)
Pt has been to CT Scan already EDp at bedside. Olivia RN at bedside.

## 2017-08-11 NOTE — ED Provider Notes (Signed)
[facility] Emergency Department Provider Note    First MD Initiated Contact with Patient 08/11/17 1607     (approximate)  I have reviewed the triage vital signs and the nursing notes.   HISTORY  Chief Complaint No chief complaint on file.    HPI Mandy George is a 84 y.o. female with a history of diverticulitis as well as pancreatitis presents with chief complaint of jittery feeling as well as abdominal bloating.  Patient stated in triage that she was having trouble finding her words so code stroke was called but on my evaluation the patient is speaking fluently denies any numbness or tingling, no lateralizing weakness and states that this is happened multiple times before.  Denies any fevers.  No headache.  No diarrhea or dysuria.  States that she was having her hand to cramp as well when she was having trouble finding words earlier today  Past Medical History:  Diagnosis Date  . Diverticulitis   . Hypercholesteremia   . Hypertension   . Pancreatitis   . Thyroid activity decreased    Family History  Problem Relation Age of Onset  . Thyroid disease Mother   . Hypertension Mother   . Hyperlipidemia Sister   . Hyperlipidemia Brother    Past Surgical History:  Procedure Laterality Date  . TONSILLECTOMY     There are no active problems to display for this patient.     Prior to Admission medications   Medication Sig Start Date End Date Taking? Authorizing Provider  fexofenadine (ALLEGRA) 180 MG tablet Take 180 mg by mouth daily.   Yes [provider]  hydrALAZINE (APRESOLINE) 50 MG tablet Take 50 mg by mouth 2 (two) times daily. 07/27/17  Yes [provider]  hydrochlorothiazide (HYDRODIURIL) 12.5 MG tablet Take 1 tablet by mouth every Monday, Wednesday, and Friday. 07/01/17  Yes [provider]  levothyroxine (SYNTHROID, LEVOTHROID) 88 MCG tablet Take 1 tablet by mouth every morning. 01/03/15  Yes [provider]   losartan (COZAAR) 100 MG tablet Take 100 mg by mouth daily. 08/03/17  Yes [provider]  metoprolol succinate (TOPROL-XL) 25 MG 24 hr tablet Take 25 mg by mouth daily. 12/12/14  Yes [provider]  Vitamin D, Ergocalciferol, (DRISDOL) 50000 units CAPS capsule Take 1 capsule by mouth once a week. 07/28/17  Yes [provider]  hydrocortisone (ANUSOL-HC) 25 MG suppository Place 1 suppository (25 mg total) rectally 2 (two) times daily. Patient not taking: Reported on 08/11/2017 02/15/15   Ida RogueLundquist, Christopher, MD    Allergies Ivp dye [iodinated diagnostic agents]; Eggs or egg-derived products; Mushroom extract complex; and Shrimp [shellfish allergy]    Social History Social History   Tobacco Use  . Smoking status: Never Smoker  . Smokeless tobacco: Never Used  Substance Use Topics  . Alcohol use: Yes    Comment: occassional  . Drug use: No    Review of Systems Patient denies headaches, rhinorrhea, blurry vision, numbness, shortness of breath, chest pain, edema, cough, abdominal pain, nausea, vomiting, diarrhea, dysuria, fevers, rashes or hallucinations unless otherwise stated above in HPI. ____________________________________________   PHYSICAL EXAM:  VITAL SIGNS: There were no vitals filed for this visit.  Constitutional: Alert and oriented. anxious appearing but in no acute distress. Eyes: Conjunctivae are normal.  Head: Atraumatic. Nose: No congestion/rhinnorhea. Mouth/Throat: Mucous membranes are moist.   Neck: No stridor. Painless ROM.  Cardiovascular: Normal rate, regular rhythm. Grossly normal heart sounds.  Good peripheral circulation. Respiratory: Normal  respiratory effort.  No retractions. Lungs CTAB. Gastrointestinal: Soft and nontender. No distention. No abdominal bruits. No CVA tenderness. Genitourinary:  Musculoskeletal: No lower extremity tenderness nor edema.  No joint effusions. Neurologic:  CN- intact.  No facial droop, Normal  FNF.  Normal heel to shin.  Sensation intact bilaterally. Normal speech and language. No gross focal neurologic deficits are appreciated. No gait instability. Skin:  Skin is warm, dry and intact. No rash noted. Psychiatric: Mood and affect are normal. Speech and behavior are normal.  ____________________________________________   LABS (all labs ordered are listed, but only abnormal results are displayed)  Results for orders placed or performed during the hospital encounter of 08/11/17 (from the past 24 hour(s))  Glucose, capillary     Status: None   Collection Time: 08/11/17  3:54 PM  Result Value Ref Range   Glucose-Capillary 91 65 - 99 mg/dL  Protime-INR     Status: None   Collection Time: 08/11/17  4:03 PM  Result Value Ref Range   Prothrombin Time 12.2 11.4 - 15.2 seconds   INR 0.91   APTT     Status: None   Collection Time: 08/11/17  4:03 PM  Result Value Ref Range   aPTT 26 24 - 36 seconds  CBC     Status: None   Collection Time: 08/11/17  4:03 PM  Result Value Ref Range   WBC 5.4 3.6 - 11.0 K/uL   RBC 5.00 3.80 - 5.20 MIL/uL   Hemoglobin 14.1 12.0 - 16.0 g/dL   HCT 16.1 09.6 - 04.5 %   MCV 84.3 80.0 - 100.0 fL   MCH 28.2 26.0 - 34.0 pg   MCHC 33.5 32.0 - 36.0 g/dL   RDW 40.9 81.1 - 91.4 %   Platelets 317 150 - 440 K/uL  Differential     Status: None   Collection Time: 08/11/17  4:03 PM  Result Value Ref Range   Neutrophils Relative % 50 %   Neutro Abs 2.7 1.4 - 6.5 K/uL   Lymphocytes Relative 37 %   Lymphs Abs 2.0 1.0 - 3.6 K/uL   Monocytes Relative 9 %   Monocytes Absolute 0.5 0.2 - 0.9 K/uL   Eosinophils Relative 3 %   Eosinophils Absolute 0.2 0 - 0.7 K/uL   Basophils Relative 1 %   Basophils Absolute 0.1 0 - 0.1 K/uL  Comprehensive metabolic panel     Status: Abnormal   Collection Time: 08/11/17  4:03 PM  Result Value Ref Range   Sodium 126 (L) 135 - 145 mmol/L   Potassium 3.8 3.5 - 5.1 mmol/L   Chloride 90 (L) 101 - 111 mmol/L   CO2 27 22 - 32 mmol/L     Glucose, Bld 103 (H) 65 - 99 mg/dL   BUN 18 6 - 20 mg/dL   Creatinine, Ser 7.82 0.44 - 1.00 mg/dL   Calcium 9.4 8.9 - 95.6 mg/dL   Total Protein 7.3 6.5 - 8.1 g/dL   Albumin 4.2 3.5 - 5.0 g/dL   AST 28 15 - 41 U/L   ALT 13 (L) 14 - 54 U/L   Alkaline Phosphatase 83 38 - 126 U/L   Total Bilirubin 0.8 0.3 - 1.2 mg/dL   GFR calc non Af Amer 54 (L) >60 mL/min   GFR calc Af Amer >60 >60 mL/min   Anion gap 9 5 - 15  Troponin I     Status: None   Collection Time: 08/11/17  4:03 PM  Result Value  Ref Range   Troponin I <0.03 <0.03 ng/mL   ____________________________________________  EKG My review and personal interpretation at Time: 15:55   Indication: abd pain  Rate: 80  Rhythm: sinus Axis: normal Other: normal intervals, no stemi, unchanged from previous ekg  ____________________________________________  RADIOLOGY  I personally reviewed all radiographic images ordered to evaluate for the above acute complaints and reviewed radiology reports and findings.  These findings were personally discussed with the patient.  Please see medical record for radiology report.  ____________________________________________   PROCEDURES  Procedure(s) performed:  .Critical Care Performed by: Willy Eddy, MD Authorized by: Willy Eddy, MD   Critical care provider statement:    Critical care time (minutes):  35   Critical care time was exclusive of:  Separately billable procedures and treating other patients   Critical care was necessary to treat or prevent imminent or life-threatening deterioration of the following conditions:  Metabolic crisis   Critical care was time spent personally by me on the following activities:  Development of treatment plan with patient or surrogate, discussions with consultants, evaluation of patient's response to treatment, examination of patient, obtaining history from patient or surrogate, ordering and performing treatments and interventions, ordering  and review of laboratory studies, ordering and review of radiographic studies, pulse oximetry, re-evaluation of patient's condition and review of old charts      Critical Care performed: yes ____________________________________________   INITIAL IMPRESSION / ASSESSMENT AND PLAN / ED COURSE  Pertinent labs & imaging results that were available during my care of the patient were reviewed by me and considered in my medical decision making (see chart for details).  DDX: Dehydration, sepsis, pna, uti, hypoglycemia, cva, drug effect, withdrawal, encephalitis   Mandy George is a 84 y.o. who presents to the ED with above symptoms.  Initially was called code stroke however patient does not show any signs or symptoms of CVA at this point does appear to be having some discomfort in her abdomen.  Based on her history will order CT imaging.  CT head shows no acute abnormality.  Based on her findings and neuro exam this does not seem clinically consistent with TIA.  Will evaluate for metabolic processes particular given her multiple symptoms.  Do not feel this is reflective of CVA.  Blood work does show evidence of hyponatremia for which I will give IV fluids as this may be the etiology of her symptoms.  Clinical Course as of Aug 11 1752  Mon Aug 11, 2017  1749 Patient with persistent jittery sensation and is describing paresthesias.  States that she has been feeling weak and having difficulty ambulating.  Given her hyponatremia I do think it is reasonable to observe the patient for additional IV fluids and reassessment.  Have discussed with the patient and available family all diagnostics and treatments performed thus far and all questions were answered to the best of my ability. The patient demonstrates understanding and agreement with plan.    [PR]    Clinical Course User Index [PR] Willy Eddy, MD     As part of my medical decision making, I reviewed the following data within the  electronic MEDICAL RECORD NUMBER Nursing notes reviewed and incorporated, Labs reviewed, notes from prior ED visits and [place] Controlled Substance Database   ____________________________________________   FINAL CLINICAL IMPRESSION(S) / ED DIAGNOSES  Final diagnoses:  Hyponatremia  Tingling      NEW MEDICATIONS STARTED DURING THIS VISIT:  New Prescriptions   No medications on file  Note:  This document was prepared using Dragon voice recognition software and may include unintentional dictation errors.    Willy Eddy, MD 08/11/17 [id number]

## 2017-08-11 NOTE — ED Notes (Signed)
Pt states with in the hour her speak changed and Left hand drew up tight. Pt is has a tremor as baseline. Pt states she didn't take 1 of her meds this morning. Pt is alert and talking to EDP able to express her thoughts and words.

## 2017-08-11 NOTE — Progress Notes (Signed)
Chaplain responded to a code stroke. Pt was alert and son and daughter were at the bedside. Chaplain practiced ministry of presence and silent pray.    08/11/17 1600  Clinical Encounter Type  Visited With Patient and family together  Visit Type Initial  Referral From Care management  Spiritual Encounters  Spiritual Needs Prayer;Emotional

## 2017-08-11 NOTE — ED Triage Notes (Signed)
First nurse note-1-2 hours ago pt started having difficulty finding words. Has abdominal pain this morning but stroke symptoms started 1-2 hrs PTA.  Pt reports still feels like having trouble with words. Reports knows what wants to say but can't always make them come out.

## 2017-08-11 NOTE — Code Documentation (Signed)
Pt arrived with complaints of "trouble finding her words" and lower abd bloating and pain, code stroke activated in triage, pt taken from triage to CT for non-con head CT and then room 12, per family pt woke up this AM complaining of lower bad pain, per daughter (who lives with pt) she fixed her some oatmeal and talked to her about 11 am and pt stated she was feeling better, states several similar "episodes" of abd pain with shaking and cramped hands over the past few weeks, pt states she took a nap around 12 and woke up with both her hands "drawn up", EDP arrived to room for assessment at 1550, pt speech WNL, denies any sensory and numbness, denies any abd pain, pt at baseline, code stroke canceled, [facility]

## 2017-08-11 NOTE — ED Notes (Signed)
CBG 91 

## 2017-08-11 NOTE — ED Notes (Signed)
Edp at bedside assessed pt and stated to cancel Code Stroke Call. Pt complains of abd bloating and pain with jitters pt speech WNL pt denies any sensory deficients tingling or numbness.

## 2017-08-12 DIAGNOSIS — E7800 Pure hypercholesterolemia, unspecified: Secondary | ICD-10-CM | POA: Diagnosis present

## 2017-08-12 DIAGNOSIS — I10 Essential (primary) hypertension: Secondary | ICD-10-CM | POA: Diagnosis present

## 2017-08-12 DIAGNOSIS — Z91012 Allergy to eggs: Secondary | ICD-10-CM | POA: Diagnosis not present

## 2017-08-12 DIAGNOSIS — T502X5A Adverse effect of carbonic-anhydrase inhibitors, benzothiadiazides and other diuretics, initial encounter: Secondary | ICD-10-CM | POA: Diagnosis present

## 2017-08-12 DIAGNOSIS — Z79890 Hormone replacement therapy (postmenopausal): Secondary | ICD-10-CM | POA: Diagnosis not present

## 2017-08-12 DIAGNOSIS — Z91013 Allergy to seafood: Secondary | ICD-10-CM | POA: Diagnosis not present

## 2017-08-12 DIAGNOSIS — E039 Hypothyroidism, unspecified: Secondary | ICD-10-CM | POA: Diagnosis present

## 2017-08-12 DIAGNOSIS — H9190 Unspecified hearing loss, unspecified ear: Secondary | ICD-10-CM | POA: Diagnosis present

## 2017-08-12 DIAGNOSIS — Z91018 Allergy to other foods: Secondary | ICD-10-CM | POA: Diagnosis not present

## 2017-08-12 DIAGNOSIS — Z91041 Radiographic dye allergy status: Secondary | ICD-10-CM | POA: Diagnosis not present

## 2017-08-12 DIAGNOSIS — E871 Hypo-osmolality and hyponatremia: Secondary | ICD-10-CM | POA: Diagnosis present

## 2017-08-12 DIAGNOSIS — Z66 Do not resuscitate: Secondary | ICD-10-CM | POA: Diagnosis present

## 2017-08-12 DIAGNOSIS — Z79899 Other long term (current) drug therapy: Secondary | ICD-10-CM | POA: Diagnosis not present

## 2017-08-12 LAB — BASIC METABOLIC PANEL
Anion gap: 6 (ref 5–15)
BUN: 14 mg/dL (ref 6–20)
CO2: 26 mmol/L (ref 22–32)
Calcium: 8.1 mg/dL — ABNORMAL LOW (ref 8.9–10.3)
Chloride: 96 mmol/L — ABNORMAL LOW (ref 101–111)
Creatinine, Ser: 1.03 mg/dL — ABNORMAL HIGH (ref 0.44–1.00)
GFR calc Af Amer: 53 mL/min — ABNORMAL LOW (ref >60)
GFR calc non Af Amer: 46 mL/min — ABNORMAL LOW (ref >60)
Glucose, Bld: 91 mg/dL (ref 65–99)
POTASSIUM: 3.6 mmol/L (ref 3.5–5.1)
SODIUM: 128 mmol/L — AB (ref 135–145)

## 2017-08-12 LAB — CBC
HCT: 35.3 % (ref 35.0–47.0)
HEMOGLOBIN: 11.8 g/dL — AB (ref 12.0–16.0)
MCH: 28.7 pg (ref 26.0–34.0)
MCHC: 33.4 g/dL (ref 32.0–36.0)
MCV: 85.9 fL (ref 80.0–100.0)
Platelets: 250 10*3/uL (ref 150–440)
RBC: 4.11 MIL/uL (ref 3.80–5.20)
RDW: 13.2 % (ref 11.5–14.5)
WBC: 5.4 10*3/uL (ref 3.6–11.0)

## 2017-08-12 MED ORDER — SODIUM CHLORIDE 1 G PO TABS
1.0000 g | ORAL_TABLET | Freq: Three times a day (TID) | ORAL | Status: DC
  Administered 2017-08-12 – 2017-08-13 (×2): 1 g via ORAL
  Filled 2017-08-12 (×4): qty 1

## 2017-08-12 NOTE — Progress Notes (Signed)
Advanced Care Plan.  Purpose of Encounter: CODE STATUS. Parties in Attendance: The patient, her daughter and me. Patient's Decisional Capacity: Yes. Medical Story:  Mandy George  is a 84 y.o. female with a known history of diverticulitis, hypercholesterolemia, hypertension, thyroid disease.  She came to the ED due to dizziness, weakness and tremors.  She was found hyponatremia and started normal saline IV.  I discussed with the patient and her daughter about the patient's condition and CODE STATUS.  The patient was full code.  The patient wants DNR after discussion.  Her daughter agrees.  Plan:  Code Status: DNR. Time spent discussing advance care planning: 22 minutes.

## 2017-08-12 NOTE — Progress Notes (Signed)
[employer] - [place] [facility]   PATIENT NAME: Mandy George    MR#:  [id number]  DATE OF BIRTH:  11/17/1923  SUBJECTIVE:  CHIEF COMPLAINT:  No chief complaint on file.  Generalized weakness, leg cramps and gas bloating REVIEW OF SYSTEMS:  Review of Systems  Constitutional: Positive for malaise/fatigue. Negative for chills and fever.  HENT: Negative for sore throat.   Eyes: Negative for blurred vision and double vision.  Respiratory: Negative for cough, hemoptysis, shortness of breath, wheezing and stridor.   Cardiovascular: Positive for leg swelling. Negative for chest pain, palpitations and orthopnea.  Gastrointestinal: Negative for abdominal pain, blood in stool, diarrhea, melena, nausea and vomiting.  Genitourinary: Negative for dysuria, flank pain and hematuria.  Musculoskeletal: Negative for back pain and joint pain.  Skin: Negative for rash.  Neurological: Negative for dizziness, sensory change, focal weakness, seizures, loss of consciousness, weakness and headaches.  Endo/Heme/Allergies: Negative for polydipsia.  Psychiatric/Behavioral: Negative for depression. The patient is not nervous/anxious.     DRUG ALLERGIES:   Allergies  Allergen Reactions  . Ivp Dye [Iodinated Diagnostic Agents] Photosensitivity  . Eggs Or Egg-Derived Products Swelling  . Mushroom Extract Complex Swelling  . Shrimp [Shellfish Allergy] Swelling   VITALS:  Blood pressure (!) 107/49, pulse 67, temperature 97.7 F (36.5 C), temperature source Oral, resp. rate 16, height 5\' 7"  (1.702 m), weight (!) 350 lb (158.8 kg), SpO2 94 %. PHYSICAL EXAMINATION:  Physical Exam  Constitutional: She is oriented to person, place, and time. She appears well-developed.  HENT:  Head: Normocephalic.  Mouth/Throat: Oropharynx is clear and moist.  Eyes: Pupils are equal, round, and reactive to light. Conjunctivae and EOM are normal. No scleral icterus.  Neck: Normal range of motion. Neck  supple. No JVD present. No tracheal deviation present.  Cardiovascular: Normal rate, regular rhythm and normal heart sounds. Exam reveals no gallop.  No murmur heard. Pulmonary/Chest: Effort normal and breath sounds normal. No respiratory distress. She has no wheezes. She has no rales.  Abdominal: Soft. Bowel sounds are normal. She exhibits no distension. There is no tenderness. There is no rebound.  Musculoskeletal: Normal range of motion. She exhibits no edema or tenderness.  Neurological: She is alert and oriented to person, place, and time. No cranial nerve deficit.  Skin: No rash noted. No erythema.  Psychiatric: She has a normal mood and affect.   LABORATORY PANEL:  Female CBC Recent Labs  Lab 08/12/17 0438  WBC 5.4  HGB 11.8*  HCT 35.3  PLT 250   ------------------------------------------------------------------------------------------------------------------ Chemistries  Recent Labs  Lab 08/11/17 1603 08/12/17 0438  NA 126* 128*  K 3.8 3.6  CL 90* 96*  CO2 27 26  GLUCOSE 103* 91  BUN 18 14  CREATININE 0.91 1.03*  CALCIUM 9.4 8.1*  AST 28  --   ALT 13*  --   ALKPHOS 83  --   BILITOT 0.8  --    RADIOLOGY:  No results found. ASSESSMENT AND PLAN:   * hyponatremia   Likely this is secondary to diuretics use, stopped diuretics. Continue normal saline IV, salt tablets 3 times daily with meals, follow-up BMP.  * hypertension   Continue medications except for her thiazide.  * hypothyroidism   Continue levothyroxine.  * gas and bloating   CT abdomen was done which is negative.  on  lactose-free diet.  Generalized weakness.  PT evaluation suggest home health and PT. All the records are reviewed and case discussed with Care  Management/Social Worker. Management plans discussed with the patient, her daughter and they are in agreement.  CODE STATUS: DNR  TOTAL TIME TAKING CARE OF THIS PATIENT: 36 minutes.   More than 50% of the time was spent in  counseling/coordination of care: YES  POSSIBLE D/C IN 2 DAYS, DEPENDING ON CLINICAL CONDITION.   Shaune Pollack M.D on 08/12/2017 at 5:17 PM  Between 7am to 6pm - Pager - [phone]  After 6pm go to [email or web address] - Therapist, nutritional Hospitalists

## 2017-08-12 NOTE — Evaluation (Signed)
Physical Therapy Evaluation Patient Details Name: Mandy George MRN: [id number] DOB: 08-25-1923 Today's Date: 08/12/2017   History of Present Illness  Pt admitted for hyponatremia. History includes diverticulitis, HTN, thryoid dx, and pancreatitis.   Clinical Impression  Pt is a pleasant 84 year old female who was admitted for hyponatremia. Pt performs bed mobility/transfers with independence and ambulation with cga and use of IV pole. Pt able to ambulate without AD, however slightly unsteady and loses balance during turns. Recommend continued ambulation with use of SPC for improved balance and fall prevention. Pt demonstrates deficits with balance/strength. Would benefit from skilled PT to address above deficits and promote optimal return to PLOF. Recommend transition to HHPT upon discharge from acute hospitalization.       Follow Up Recommendations Home health PT    Equipment Recommendations  Cane    Recommendations for Other Services       Precautions / Restrictions Precautions Precautions: Fall Restrictions Weight Bearing Restrictions: No      Mobility  Bed Mobility Overal bed mobility: Independent             General bed mobility comments: safe technique with ease of transition  Transfers Overall transfer level: Independent Equipment used: None             General transfer comment: upright posture noted without AD.   Ambulation/Gait Ambulation/Gait assistance: Min guard Ambulation Distance (Feet): 250 Feet Assistive device: (IV pole) Gait Pattern/deviations: Step-through pattern     General Gait Details: ambulating holding onto IV pole demonstrating safe technique. Slight unsteadiness when she turns her head and with directional turns.   Stairs            Wheelchair Mobility    Modified Rankin (Stroke Patients Only)       Balance Overall balance assessment: Needs assistance Sitting-balance support: Feet supported Sitting balance-Leahy  Scale: Normal     Standing balance support: Single extremity supported Standing balance-Leahy Scale: Good                               Pertinent Vitals/Pain Pain Assessment: No/denies pain    Home Living Family/patient expects to be discharged to:: Private residence Living Arrangements: Children(son and daughter) Available Help at Discharge: Family Type of Home: House Home Access: Stairs to enter Entrance Stairs-Rails: Right Entrance Stairs-Number of Steps: 2 Home Layout: One level Home Equipment: None      Prior Function Level of Independence: Independent         Comments: active at home, cleaning her home and ambulating. No falls at this time.     Hand Dominance        Extremity/Trunk Assessment   Upper Extremity Assessment Upper Extremity Assessment: Overall WFL for tasks assessed    Lower Extremity Assessment Lower Extremity Assessment: Generalized weakness(B LE grossly 4/5)       Communication   Communication: No difficulties  Cognition Arousal/Alertness: Awake/alert Behavior During Therapy: WFL for tasks assessed/performed Overall Cognitive Status: Within Functional Limits for tasks assessed                                        General Comments      Exercises Other Exercises Other Exercises: ambulated to bathroom with cga and use of IV pole for balance. Needs slight cga for sitting on low seat. Able to perform  self hygiene with supervision. Set up performed   Assessment/Plan    PT Assessment Patient needs continued PT services  PT Problem List Decreased strength;Decreased activity tolerance;Decreased balance;Decreased mobility       PT Treatment Interventions DME instruction;Gait training;Stair training;Therapeutic exercise;Balance training    PT Goals (Current goals can be found in the Care Plan section)  Acute Rehab PT Goals Patient Stated Goal: to go home PT Goal Formulation: With patient Time For Goal  Achievement: 08/26/17 Potential to Achieve Goals: Good    Frequency Min 2X/week   Barriers to discharge        Co-evaluation               AM-PAC PT "6 Clicks" Daily Activity  Outcome Measure Difficulty turning over in bed (including adjusting bedclothes, sheets and blankets)?: None Difficulty moving from lying on back to sitting on the side of the bed? : None Difficulty sitting down on and standing up from a chair with arms (e.g., wheelchair, bedside commode, etc,.)?: None Help needed moving to and from a bed to chair (including a wheelchair)?: A Little Help needed walking in hospital room?: A Little Help needed climbing 3-5 steps with a railing? : A Little 6 Click Score: 21    End of Session Equipment Utilized During Treatment: Gait belt Activity Tolerance: Patient tolerated treatment well Patient left: in chair;with chair alarm set Nurse Communication: Mobility status PT Visit Diagnosis: Unsteadiness on feet (R26.81);Muscle weakness (generalized) (M62.81)    Time: [id number] PT Time Calculation (min) (ACUTE ONLY): 22 min   Charges:   PT Evaluation $PT Eval Low Complexity: 1 Low PT Treatments $Therapeutic Activity: 8-22 mins   PT G CodesElizabeth Palau:        Doraine Schexnider, PT, DPT [phone]   Norabelle Kondo 08/12/2017, 2:39 PM

## 2017-08-13 LAB — BASIC METABOLIC PANEL
ANION GAP: 4 — AB (ref 5–15)
BUN: 15 mg/dL (ref 6–20)
CO2: 27 mmol/L (ref 22–32)
Calcium: 8.4 mg/dL — ABNORMAL LOW (ref 8.9–10.3)
Chloride: 103 mmol/L (ref 101–111)
Creatinine, Ser: 0.8 mg/dL (ref 0.44–1.00)
GFR calc Af Amer: >60 mL/min (ref >60)
GFR calc non Af Amer: >60 mL/min (ref >60)
GLUCOSE: 78 mg/dL (ref 65–99)
POTASSIUM: 3.6 mmol/L (ref 3.5–5.1)
Sodium: 134 mmol/L — ABNORMAL LOW (ref 135–145)

## 2017-08-13 MED ORDER — SODIUM CHLORIDE 1 G PO TABS: 1.0000 g | ORAL_TABLET | Freq: Every day | ORAL | 0 refills | Status: AC

## 2017-08-13 NOTE — Care Management Note (Signed)
Case Management Note  Patient Details  Name: Mandy George MRN: [id number] Date of Birth: 04/08/1924   Patient admitted for hyponatremia.  Patient lives at home.  Her adult children live in the home and provide transportation.  PCP Welton FlakesKhan.  Patient does not have any home medical equipment.  PT has assessed patient and recommend home health PT and cane.  Patient agreeable to home health services.  Does not wish to have a cane provided.  Patient states that she does not have a preference of home health agency.  [facility]  RNCM signing off.   Subjective/Objective:                    Action/Plan:   Expected Discharge Date:  08/13/17               Expected Discharge Plan:  Home w Home Health Services  In-House Referral:     Discharge planning Services  CM Consult  Post Acute Care Choice:  Home Health Choice offered to:  Patient  DME Arranged:    DME Agency:     HH Arranged:  RN, PT HH Agency:  [facility]  Status of Service:     If discussed at Long Length of Stay Meetings, dates discussed:    Additional Comments:  Chapman FitchBOWEN, Harden Bramer T, RN 08/13/2017, 11:55 AM

## 2017-08-13 NOTE — Discharge Summary (Signed)
[facility] - [place] at [facility]   PATIENT NAME: Mandy George    MR#:  [id number]  DATE OF BIRTH:  1923/08/11  DATE OF ADMISSION:  08/11/2017 ADMITTING PHYSICIAN: Altamese Dilling, MD  DATE OF DISCHARGE:  08/13/17 PRIMARY CARE PHYSICIAN: Margaretann Loveless, MD    ADMISSION DIAGNOSIS:  Hyponatremia [E87.1] Tingling [R20.2]  DISCHARGE DIAGNOSIS:  Active Problems:   Hyponatremia   SECONDARY DIAGNOSIS:   Past Medical History:  Diagnosis Date  . Diverticulitis   . Hypercholesteremia   . Hypertension   . Pancreatitis   . Thyroid activity decreased     HOSPITAL COURSE:  HISTORY OF PRESENT ILLNESS: Mandy George  is a 84 y.o. female with a known history of diverticulitis, hypercholesterolemia, hypertension, thyroid disease thought to emergency room by her daughter and son who lives with her. Patient is hard of hearing and not a very good historian so all history obtained from her daughter in the room.  For last few weeks patient has on and off symptoms of feeling dizzy, weak, tremors and numbness and burning sensation in her legs. This symptoms are on and off and sometimes she will also have word finding difficulties and confusion. At her baseline she was able to get up and walk and take care of herself, but last few weeks she appears more weak as per family. She also complained of bloating and gas. Concerned with this family decided to bring her to emergency room, on initial workup CT head was negative, CT abdomen was also done for her abdominal complaints which was negative also. Her sodium was noted to be low and so she is given for admission.     * hyponatremia Likely this is secondary to diuretics use, stopped diuretic hctz Improved with normal saline IV, salt tablets 3 times daily with meals follow-up BMP sodium 134. Discharge with salt tablet once daily for 3 more days  * hypertension Continue medications except for her thiazide.  *  hypothyroidism Continue levothyroxine.  * gas and bloating CT abdomen was done which is negative. on  lactose-free diet.  Generalized weakness.  PT evaluation suggest home health and PT.   DISCHARGE CONDITIONS:  FAIR  CONSULTS OBTAINED:     PROCEDURES  NONE   DRUG ALLERGIES:   Allergies  Allergen Reactions  . Ivp Dye [Iodinated Diagnostic Agents] Photosensitivity  . Eggs Or Egg-Derived Products Swelling  . Mushroom Extract Complex Swelling  . Shrimp [Shellfish Allergy] Swelling    DISCHARGE MEDICATIONS:   Allergies as of 08/13/2017      Reactions   Ivp Dye [iodinated Diagnostic Agents] Photosensitivity   Eggs Or Egg-derived Products Swelling   Mushroom Extract Complex Swelling   Shrimp [shellfish Allergy] Swelling      Medication List    STOP taking these medications   hydrochlorothiazide 12.5 MG tablet Commonly known as:  HYDRODIURIL   hydrocortisone 25 MG suppository Commonly known as:  ANUSOL-HC     TAKE these medications   fexofenadine 180 MG tablet Commonly known as:  ALLEGRA Take 180 mg by mouth daily.   hydrALAZINE 50 MG tablet Commonly known as:  APRESOLINE Take 50 mg by mouth 2 (two) times daily.   levothyroxine 88 MCG tablet Commonly known as:  SYNTHROID, LEVOTHROID Take 1 tablet by mouth every morning.   losartan 100 MG tablet Commonly known as:  COZAAR Take 100 mg by mouth daily.   metoprolol succinate 25 MG 24 hr tablet Commonly known as:  TOPROL-XL Take 25 mg  by mouth daily.   sodium chloride 1 g tablet Take 1 tablet (1 g total) by mouth daily for 3 days.   Vitamin D (Ergocalciferol) 50000 units Caps capsule Commonly known as:  DRISDOL Take 1 capsule by mouth once a week.        DISCHARGE INSTRUCTIONS:   Follow-up with primary care physician in a week  DIET:  Reg diet with no added salt  DISCHARGE CONDITION:  Fair  ACTIVITY:  Activity as tolerated  OXYGEN:  Home Oxygen: No.   Oxygen Delivery: room  air  DISCHARGE LOCATION:  home   If you experience worsening of your admission symptoms, develop shortness of breath, life threatening emergency, suicidal or homicidal thoughts you must seek medical attention immediately by calling 911 or calling your MD immediately  if symptoms less severe.  You Must read complete instructions/literature along with all the possible adverse reactions/side effects for all the Medicines you take and that have been prescribed to you. Take any new Medicines after you have completely understood and accpet all the possible adverse reactions/side effects.   Please note  You were cared for by a hospitalist during your hospital stay. If you have any questions about your discharge medications or the care you received while you were in the hospital after you are discharged, you can call the unit and asked to speak with the hospitalist on call if the hospitalist that took care of you is not available. Once you are discharged, your primary care physician will handle any further medical issues. Please note that NO REFILLS for any discharge medications will be authorized once you are discharged, as it is imperative that you return to your primary care physician (or establish a relationship with a primary care physician if you do not have one) for your aftercare needs so that they can reassess your need for medications and monitor your lab values.     Today  No chief complaint on file.  Recent is doing fine.  No symptoms.  Denies any muscle cramps or weakness.  Brother at bedside.  ROS:  CONSTITUTIONAL: Denies fevers, chills. Denies any fatigue, weakness.  EYES: Denies blurry vision, double vision, eye pain. EARS, NOSE, THROAT: Denies tinnitus, ear pain, hearing loss. RESPIRATORY: Denies cough, wheeze, shortness of breath.  CARDIOVASCULAR: Denies chest pain, palpitations, edema.  GASTROINTESTINAL: Denies nausea, vomiting, diarrhea, abdominal pain. Denies bright red blood  per rectum. GENITOURINARY: Denies dysuria, hematuria. ENDOCRINE: Denies nocturia or thyroid problems. HEMATOLOGIC AND LYMPHATIC: Denies easy bruising or bleeding. SKIN: Denies rash or lesion. MUSCULOSKELETAL: Denies pain in neck, back, shoulder, knees, hips or arthritic symptoms.  NEUROLOGIC: Denies paralysis, paresthesias.  PSYCHIATRIC: Denies anxiety or depressive symptoms.   VITAL SIGNS:  Blood pressure (!) 146/67, pulse 62, temperature (!) 97.3 F (36.3 C), temperature source Oral, resp. rate 18, height 5\' 7"  (1.702 m), weight (!) 158.8 kg (350 lb), SpO2 97 %.  I/O:    Intake/Output Summary (Last 24 hours) at 08/13/2017 1357 Last data filed at 08/13/2017 1102 Gross per 24 hour  Intake 2252 ml  Output 500 ml  Net 1752 ml    PHYSICAL EXAMINATION:  GENERAL:  84 y.o.-year-old patient lying in the bed with no acute distress.  EYES: Pupils equal, round, reactive to light and accommodation. No scleral icterus. Extraocular muscles intact.  HEENT: Head atraumatic, normocephalic. Oropharynx and nasopharynx clear.  NECK:  Supple, no jugular venous distention. No thyroid enlargement, no tenderness.  LUNGS: Normal breath sounds bilaterally, no wheezing, rales,rhonchi or crepitation.  No use of accessory muscles of respiration.  CARDIOVASCULAR: S1, S2 normal. No murmurs, rubs, or gallops.  ABDOMEN: Soft, non-tender, non-distended. Bowel sounds present. No organomegaly or mass.  EXTREMITIES: No pedal edema, cyanosis, or clubbing.  NEUROLOGIC: Cranial nerves II through XII are intact. Muscle strength generalized weakness in all extremities. Sensation intact. Gait not checked.  PSYCHIATRIC: The patient is alert and oriented x 3.  SKIN: No obvious rash, lesion, or ulcer.   DATA REVIEW:   CBC Recent Labs  Lab 08/12/17 0438  WBC 5.4  HGB 11.8*  HCT 35.3  PLT 250    Chemistries  Recent Labs  Lab 08/11/17 1603  08/13/17 1155  NA 126*   < > 134*  K 3.8   < > 3.6  CL 90*   < > 103   CO2 27   < > 27  GLUCOSE 103*   < > 78  BUN 18   < > 15  CREATININE 0.91   < > 0.80  CALCIUM 9.4   < > 8.4*  AST 28  --   --   ALT 13*  --   --   ALKPHOS 83  --   --   BILITOT 0.8  --   --    < > = values in this interval not displayed.    Cardiac Enzymes Recent Labs  Lab 08/11/17 1603  TROPONINI <0.03    Microbiology Results  No results found for this or any previous visit.  RADIOLOGY:  Ct Abdomen Pelvis Wo Contrast  Result Date: 08/11/2017 CLINICAL DATA:  84 year old female with intermittent bloating and abdominal distention since early January but worse recently. History of diverticulitis and pancreatitis. Initial encounter. EXAM: CT ABDOMEN AND PELVIS WITHOUT CONTRAST TECHNIQUE: Multidetector CT imaging of the abdomen and pelvis was performed following the standard protocol without IV contrast. COMPARISON:  01/16/2015 CT. FINDINGS: Lower chest: Mild scarring lung bases. Mild pectus deformity. Slight impression upon the heart. Hepatobiliary: Left lobe of liver cyst similar to prior exam spanning over 3.6 cm No calcified gallstone. Pancreas: Taking into account limitation by non contrast imaging, no worrisome mass. Spleen: Tiny calcification within the spleen. Taking into account limitation by non contrast imaging, no worrisome splenic lesion or enlargement. Adrenals/Urinary Tract: No obstructing stone or hydronephrosis. Taking into account limitation by non contrast imaging, no worrisome renal or adrenal lesion. Noncontrast filled views of the urinary bladder unremarkable. Stomach/Bowel: Prominent duodenal diverticulum incidentally noted. Prominent sigmoid diverticulosis/muscular hypertrophy without CT evidence of diverticulitis. Vascular/Lymphatic: Atherosclerotic changes abdominal aorta with slight ectasia without aneurysm. Atherosclerotic changes aortic branch vessels. No adenopathy. Reproductive: Uterus tilted to the right. No worrisome adnexal mass. Other: No free air or bowel  containing hernia. Musculoskeletal: Mild scoliosis lumbar spine convex left with superimposed degenerative changes L3-4 through L5-S1. IMPRESSION: Prominent sigmoid diverticulosis (with significant associated muscular hypertrophy) without CT evidence diverticulitis. Prominent duodenal diverticulum incidentally noted. Aortic Atherosclerosis (ICD10-I70.0). Relatively similar appearance of cyst within the left lobe liver spanning over 3.6 cm. Electronically Signed   By: Lacy Duverney M.D.   On: 08/11/2017 17:15   Ct Head Code Stroke Wo Contrast  Result Date: 08/11/2017 CLINICAL DATA:  Code stroke. Intermittent aphasia over the last month with difficulty during the last hour. Focal neuro deficit of less than 6 hours. EXAM: CT HEAD WITHOUT CONTRAST TECHNIQUE: Contiguous axial images were obtained from the base of the skull through the vertex without intravenous contrast. COMPARISON:  CT head without contrast 07/25/2013. FINDINGS: Brain: Atrophy and white  matter changes are stable. No acute infarct, hemorrhage, or mass lesion is present. The ventricles are proportionate to the degree of atrophy. Brainstem and cerebellum are normal. No significant extra-axial fluid collection is present. Vascular: Atherosclerotic calcifications are present within the cavernous internal carotid arteries bilaterally. There is no hyperdense vessel. Skull: Calvarium is intact. No focal lytic or blastic lesions are present. No significant extracranial soft tissue injury is present. Sinuses/Orbits: The paranasal sinuses and mastoid air cells are clear. A right lens replacement is present. Globes and orbits are otherwise within normal limits. ASPECTS [facility]([place] Stroke Program Early CT Score) - Ganglionic level infarction (caudate, lentiform nuclei, internal capsule, insula, M1-M3 cortex): 7/7 - Supraganglionic infarction (M4-M6 cortex): 3/3 Total score (0-10 with 10 being normal): 10/10 IMPRESSION: 1. Stable atrophy and white matter disease.  This likely reflects the sequela of chronic microvascular ischemia. 2. No acute intracranial abnormality. 3. ASPECTS is 10/10 These results were called by telephone at the time of interpretation on 08/11/2017 at 3:51 pm to Dr. Merrily BrittleNEIL RIFENBARK , who verbally acknowledged these results. Electronically Signed   By: Marin Robertshristopher  Mattern M.D.   On: 08/11/2017 15:53    EKG:   Orders placed or performed during the hospital encounter of 08/11/17  . ED EKG  . ED EKG      Management plans discussed with the patient, family and they are in agreement.  CODE STATUS:     Code Status Orders  (From admission, onward)        Start     Ordered   08/12/17 1314  Do not attempt resuscitation (DNR)  Continuous    Question Answer Comment  In the event of cardiac or respiratory ARREST Do not call a "code blue"   In the event of cardiac or respiratory ARREST Do not perform Intubation, CPR, defibrillation or ACLS   In the event of cardiac or respiratory ARREST Use medication by any route, position, wound care, and other measures to relive pain and suffering. May use oxygen, suction and manual treatment of airway obstruction as needed for comfort.      08/12/17 1313    Code Status History    Date Active Date Inactive Code Status Order ID Comments User Context   08/11/2017 2049 08/12/2017 1313 Full Code [id number]  Altamese DillingVachhani, Vaibhavkumar, MD Inpatient    Advance Directive Documentation     Most Recent Value  Type of Advance Directive  Healthcare Power of Attorney, Living will  Pre-existing out of facility DNR order (yellow form or pink MOST form)  -  "MOST" Form in Place?  -      TOTAL TIME TAKING CARE OF THIS PATIENT: 43 minutes.   Note: This dictation was prepared with Dragon dictation along with smaller phrase technology. Any transcriptional errors that result from this process are unintentional.   @[facility] @  on 08/13/2017 at 1:57 PM  Between 7am to 6pm - Pager - [phone]  After 6pm go to  [email or web address] - password [email or web address] [email or web address]  [facility]  Office  [phone]  CC: Primary care physician; Margaretann LovelessKhan, Neelam S, MD

## 2017-08-13 NOTE — Progress Notes (Signed)
Patient cleared for discharge to home with son and daughter.        Education complete. AVS printed. Discharge instructions given. All questions answered for patient clarification.  Prescriptions given, pharmacy verified.  IV removed.  Discharged to private residence Via private vehicle.

## 2017-08-13 NOTE — Discharge Instructions (Signed)
Follow-up with primary care physician in a week ° °

## 2017-08-13 NOTE — Progress Notes (Signed)
Physical Therapy Treatment Patient Details Name: Mandy MarcusVivian S George MRN: [id number] DOB: 06/12/1923 Today's Date: 08/13/2017    History of Present Illness Pt admitted for hyponatremia. History includes diverticulitis, HTN, thryoid dx, and pancreatitis.     PT Comments    Pt is making good progress towards goals with improved ambulation with SPC this session with improved balance and upright posture. Good speed noted, however reports she doesn't feel up to walking 2nd lap at this time. Assisted pt to bathroom with safe technique. Will continue to progress.   Follow Up Recommendations  Home health PT     Equipment Recommendations  Cane    Recommendations for Other Services       Precautions / Restrictions Precautions Precautions: Fall Restrictions Weight Bearing Restrictions: No    Mobility  Bed Mobility               General bed mobility comments: received sitting at EOB upon arrival  Transfers Overall transfer level: Independent Equipment used: None             General transfer comment: upright posture noted without AD.   Ambulation/Gait Ambulation/Gait assistance: Supervision Ambulation Distance (Feet): 250 Feet Assistive device: Straight cane Gait Pattern/deviations: Step-through pattern     General Gait Details: ambulated using SPC this session with improved balance and posture holding in R hand. Per patient she usually ambulates holding onto umbrella for long distances. Safe technique performed   Stairs            Wheelchair Mobility    Modified Rankin (Stroke Patients Only)       Balance                                            Cognition Arousal/Alertness: Awake/alert Behavior During Therapy: WFL for tasks assessed/performed Overall Cognitive Status: Within Functional Limits for tasks assessed                                        Exercises Other Exercises Other Exercises: ambulated to bathroom  using IV pole for assistance with supervision. Safe technique with supervision for hygiene. Decreased set up required this session.    General Comments        Pertinent Vitals/Pain Pain Assessment: No/denies pain    Home Living                      Prior Function            PT Goals (current goals can now be found in the care plan section) Acute Rehab PT Goals Patient Stated Goal: to go home PT Goal Formulation: With patient Time For Goal Achievement: 08/26/17 Potential to Achieve Goals: Good Progress towards PT goals: Progressing toward goals    Frequency    Min 2X/week      PT Plan Current plan remains appropriate    Co-evaluation              AM-PAC PT "6 Clicks" Daily Activity  Outcome Measure  Difficulty turning over in bed (including adjusting bedclothes, sheets and blankets)?: None Difficulty moving from lying on back to sitting on the side of the bed? : None Difficulty sitting down on and standing up from a chair with arms (e.g., wheelchair, bedside commode, etc,.)?:  None Help needed moving to and from a bed to chair (including a wheelchair)?: None Help needed walking in hospital room?: None Help needed climbing 3-5 steps with a railing? : A Little 6 Click Score: 23    End of Session Equipment Utilized During Treatment: Gait belt Activity Tolerance: Patient tolerated treatment well Patient left: in chair;with chair alarm set Nurse Communication: Mobility status PT Visit Diagnosis: Unsteadiness on feet (R26.81);Muscle weakness (generalized) (M62.81)     Time: 1610-9604 PT Time Calculation (min) (ACUTE ONLY): 14 min  Charges:  $Gait Training: 8-22 mins                    G Codes:       Elizabeth Palau, PT, DPT [phone]    Louay Myrie 08/13/2017, 10:19 AM

## 2017-08-18 ENCOUNTER — Telehealth: Payer: Self-pay

## 2017-08-18 NOTE — Telephone Encounter (Signed)
Flagged on EMMI report for not knowing who to contact about changes in condition.  Called and spoke with patient.  She mentioned she had a doctor's appointment with Dr. Paulino DoorNellam Khan this afternoon and knows to call them should she have any concerns.  She reports she is doing better.  I thanked her for her time and made her aware that she would receive one more automated call within the next few days as a final check up.

## 2017-10-01 ENCOUNTER — Other Ambulatory Visit: Payer: Self-pay

## 2017-10-01 ENCOUNTER — Emergency Department: Payer: Medicare Other

## 2017-10-01 ENCOUNTER — Encounter: Payer: Self-pay | Admitting: Emergency Medicine

## 2017-10-01 ENCOUNTER — Emergency Department
Admission: EM | Admit: 2017-10-01 | Discharge: 2017-10-01 | Disposition: A | Discharge status: Home / Self Care | Payer: Medicare Other | Attending: Emergency Medicine | Admitting: Emergency Medicine

## 2017-10-01 DIAGNOSIS — R531 Weakness: Secondary | ICD-10-CM | POA: Insufficient documentation

## 2017-10-01 DIAGNOSIS — Z79899 Other long term (current) drug therapy: Secondary | ICD-10-CM | POA: Diagnosis not present

## 2017-10-01 DIAGNOSIS — R079 Chest pain, unspecified: Secondary | ICD-10-CM | POA: Diagnosis not present

## 2017-10-01 DIAGNOSIS — I10 Essential (primary) hypertension: Secondary | ICD-10-CM | POA: Insufficient documentation

## 2017-10-01 DIAGNOSIS — R0602 Shortness of breath: Secondary | ICD-10-CM | POA: Diagnosis not present

## 2017-10-01 LAB — CBC
HCT: 43.3 % (ref 35.0–47.0)
HEMOGLOBIN: 14.5 g/dL (ref 12.0–16.0)
MCH: 29.3 pg (ref 26.0–34.0)
MCHC: 33.5 g/dL (ref 32.0–36.0)
MCV: 87.4 fL (ref 80.0–100.0)
Platelets: 280 10*3/uL (ref 150–440)
RBC: 4.96 MIL/uL (ref 3.80–5.20)
RDW: 14.5 % (ref 11.5–14.5)
WBC: 5.6 10*3/uL (ref 3.6–11.0)

## 2017-10-01 LAB — URINALYSIS, COMPLETE (UACMP) WITH MICROSCOPIC
Bilirubin Urine: NEGATIVE
Glucose, UA: NEGATIVE mg/dL
Hgb urine dipstick: NEGATIVE
Ketones, ur: 20 mg/dL — AB
Leukocytes, UA: NEGATIVE
Nitrite: NEGATIVE
PH: 7 (ref 5.0–8.0)
Protein, ur: NEGATIVE mg/dL
Specific Gravity, Urine: 1.017 (ref 1.005–1.030)

## 2017-10-01 LAB — BASIC METABOLIC PANEL
Anion gap: 8 (ref 5–15)
BUN: 22 mg/dL — AB (ref 6–20)
CO2: 26 mmol/L (ref 22–32)
CREATININE: 1.05 mg/dL — AB (ref 0.44–1.00)
Calcium: 8.8 mg/dL — ABNORMAL LOW (ref 8.9–10.3)
Chloride: 103 mmol/L (ref 101–111)
GFR calc Af Amer: 52 mL/min — ABNORMAL LOW (ref >60)
GFR, EST NON AFRICAN AMERICAN: 45 mL/min — AB (ref >60)
GLUCOSE: 95 mg/dL (ref 65–99)
Potassium: 3.6 mmol/L (ref 3.5–5.1)
Sodium: 137 mmol/L (ref 135–145)

## 2017-10-01 LAB — TROPONIN I

## 2017-10-01 MED ORDER — CEPHALEXIN 500 MG PO CAPS: 500.0000 mg | ORAL_CAPSULE | Freq: Two times a day (BID) | ORAL | 0 refills | Status: AC

## 2017-10-01 MED ORDER — CEPHALEXIN 500 MG PO CAPS
500.0000 mg | ORAL_CAPSULE | Freq: Once | ORAL | Status: AC
  Administered 2017-10-01: 500 mg via ORAL
  Filled 2017-10-01: qty 1

## 2017-10-01 NOTE — ED Provider Notes (Signed)
Patient received in sign-out from Dr. Zenda Alpers.  Workup and evaluation pending P troponin urine.  Does have rare bacteria but her repeat troponin is reassuring.  At this point believe patient is stable and appropriate for outpatient follow-up.Willy Eddy, MD 10/01/17 1049

## 2017-10-01 NOTE — ED Triage Notes (Signed)
Pt brought to triage via wheelchair. Pt reports feeling tightness in her chest, weakness and a little shortness of breath.

## 2017-10-01 NOTE — ED Provider Notes (Signed)
[facility] Provider Note   ____________________________________________   First MD Initiated Contact with Patient 10/01/17 [phone]     (approximate)  I have reviewed the triage vital signs and the nursing notes.   HISTORY  Chief Complaint Weakness; Shortness of Breath; and Chest Pain    HPI Mandy George is a 84 y.o. female who comes into the hospital today not feeling well.  The patient states that she fell in her living room about a month ago.  She reports that since the fall her ribs have been sore on the right side.  She has been doctoring it for over the past month but was concerned and wanted it evaluated.  The patient's primary care physician has been out of town and she has not been feeling well.  She thinks she has a cold but she is not convinced that everything is well in her chest.  The patient had a chest x-ray done maybe about a week ago and it was negative.  The patient denies any fever and states that when she had fallen she had tripped on a sweater.  She denies any cough or runny nose.  She has occasional clear rhinorrhea.  She is also had a sinus headache according to her daughter.  The patient went to the ENT doctor a month ago and they placed her on some nasal spray for her symptoms.  She states that she does have some pain and tightness in her chest as well as some pain in her back.  The patient rates her pain a 2 out of 10 in intensity.  She is here for evaluation.   Past Medical History:  Diagnosis Date  . Diverticulitis   . Hypercholesteremia   . Hypertension   . Pancreatitis   . Thyroid activity decreased     Patient Active Problem List   Diagnosis Date Noted  . Hyponatremia 08/11/2017    Past Surgical History:  Procedure Laterality Date  . TONSILLECTOMY      Prior to Admission medications   Medication Sig Start Date End Date Taking? Authorizing Provider  fexofenadine (ALLEGRA) 180 MG tablet Take 180 mg  by mouth daily.    [provider]  hydrALAZINE (APRESOLINE) 50 MG tablet Take 50 mg by mouth 2 (two) times daily. 07/27/17   [provider]  levothyroxine (SYNTHROID, LEVOTHROID) 88 MCG tablet Take 1 tablet by mouth every morning. 01/03/15   [provider]  losartan (COZAAR) 100 MG tablet Take 100 mg by mouth daily. 08/03/17   [provider]  metoprolol succinate (TOPROL-XL) 25 MG 24 hr tablet Take 25 mg by mouth daily. 12/12/14   [provider]  Vitamin D, Ergocalciferol, (DRISDOL) 50000 units CAPS capsule Take 1 capsule by mouth once a week. 07/28/17   [provider]    Allergies Ivp dye [iodinated diagnostic agents]; Eggs or egg-derived products; Mushroom extract complex; and Shrimp [shellfish allergy]  Family History  Problem Relation Age of Onset  . Thyroid disease Mother   . Hypertension Mother   . Hyperlipidemia Sister   . Hyperlipidemia Brother     Social History Social History   Tobacco Use  . Smoking status: Never Smoker  . Smokeless tobacco: Never Used  Substance Use Topics  . Alcohol use: Yes    Comment: occassional  . Drug use: No    Review of Systems  Constitutional: unwell feeling Eyes: No visual changes. ENT: No sore throat. Cardiovascular: chest tightness, . Respiratory:  Denies shortness of breath. Gastrointestinal: No abdominal pain.  No nausea, no vomiting.  No diarrhea.  No constipation. Genitourinary: Negative for dysuria. Musculoskeletal: back pain. Skin: Negative for rash. Neurological: Negative for headaches, focal weakness or numbness.   ____________________________________________   PHYSICAL EXAM:  VITAL SIGNS: ED Triage Vitals  Enc Vitals Group     BP 10/01/17 0603 (!) 185/81     Pulse Rate 10/01/17 0603 69     Resp 10/01/17 0603 18     Temp 10/01/17 0603 98 F (36.7 C)     Temp Source 10/01/17 0603 Oral     SpO2 10/01/17 0603 98 %     Weight 10/01/17 0558 116 lb (52.6 kg)      Height 10/01/17 0558  (1.575 m)     Head Circumference --      Peak Flow --      Pain Score 10/01/17 0557 2     Pain Loc --      Pain Edu? --      Excl. in GC? --     Constitutional: Alert and oriented. Well appearing and in mild distress. Eyes: Conjunctivae are normal. PERRL. EOMI. Head: Atraumatic. Nose: No congestion/rhinnorhea. Mouth/Throat: Mucous membranes are moist.  Oropharynx non-erythematous. Cardiovascular: Normal rate, regular rhythm. Grossly normal heart sounds.  Good peripheral circulation. Respiratory: Normal respiratory effort.  No retractions. Lungs CTAB. Gastrointestinal: Soft and nontender. No distention. Positive bowel sounds Musculoskeletal: No lower extremity tenderness nor edema.   Neurologic:  Normal speech and language.  Skin:  Skin is warm, dry and intact.  Psychiatric: Mood and affect are normal.   ____________________________________________   LABS (all labs ordered are listed, but only abnormal results are displayed)  Labs Reviewed  BASIC METABOLIC PANEL - Abnormal; Notable for the following components:      Result Value   BUN 22 (*)    Creatinine, Ser 1.05 (*)    Calcium 8.8 (*)    GFR calc non Af Amer 45 (*)    GFR calc Af Amer 52 (*)    All other components within normal limits  CBC  TROPONIN I  URINALYSIS, COMPLETE (UACMP) WITH MICROSCOPIC   ____________________________________________  EKG  ED ECG REPORT I, Rebecka Apley, the attending physician, personally viewed and interpreted this ECG.   Date: 10/01/2017  EKG Time: 559  Rate: 76  Rhythm: normal sinus rhythm  Axis: normal  Intervals:none  ST&T Change: none  ____________________________________________  RADIOLOGY  ED MD interpretation:  CXR: Cardiomegaly, no acute pulmonary process identified  Official radiology report(s): Dg Chest 2 View  Result Date: 10/01/2017 CLINICAL DATA:  84 y/o F; chest tightness, weakness, shortness of breath. EXAM: CHEST - 2 VIEW  COMPARISON:  11/07/2016 chest radiograph FINDINGS: Cardiomegaly. Aortic atherosclerosis with calcification. Clear lungs. No pleural effusion or pneumothorax. No acute osseous abnormality is evident. Stable calcified mediastinal lymph nodes. IMPRESSION: Cardiomegaly.  No acute pulmonary process identified. Electronically Signed   By: Mitzi Hansen M.D.   On: 10/01/2017 06:50    ____________________________________________   PROCEDURES  Procedure(s) performed: None  Procedures  Critical Care performed: No  ____________________________________________   INITIAL IMPRESSION / ASSESSMENT AND PLAN / ED COURSE  As part of my medical decision making, I reviewed the following data within the electronic MEDICAL RECORD NUMBER Notes from prior ED visits and [place] Controlled Substance Database   This is a 84 year old female who comes into the hospital today with an unwell feeling.  She has been having some  chest discomfort especially after falling about a month ago.  My differential diagnosis includes pneumonia, electrolyte abnormality, urinary tract infection, acute coronary syndrome.  I will check some blood work to include a CBC, BMP, troponin, urinalysis, chest x-ray.  The patient will be reassessed once I received all of her results.  We are still awaiting the results of the patient's urinalysis and she will have a repeat troponin.  She will be reassessed once to receive all of her blood work and study results.      ____________________________________________   FINAL CLINICAL IMPRESSION(S) / ED DIAGNOSES  Final diagnoses:  Generalized weakness  Chest pain, unspecified type  Shortness of breath     ED Discharge Orders    None       Note:  This document was prepared using Dragon voice recognition software and may include unintentional dictation errors.    Rebecka Apley, MD 10/01/17 [phone]

## 2017-11-26 ENCOUNTER — Emergency Department
Admission: EM | Admit: 2017-11-26 | Discharge: 2017-11-26 | Disposition: A | Discharge status: Home / Self Care | Payer: Medicare Other | Attending: Emergency Medicine | Admitting: Emergency Medicine

## 2017-11-26 ENCOUNTER — Other Ambulatory Visit: Payer: Self-pay

## 2017-11-26 ENCOUNTER — Encounter: Payer: Self-pay | Admitting: Emergency Medicine

## 2017-11-26 DIAGNOSIS — Z79899 Other long term (current) drug therapy: Secondary | ICD-10-CM | POA: Insufficient documentation

## 2017-11-26 DIAGNOSIS — R531 Weakness: Secondary | ICD-10-CM

## 2017-11-26 DIAGNOSIS — I10 Essential (primary) hypertension: Secondary | ICD-10-CM | POA: Insufficient documentation

## 2017-11-26 LAB — URINALYSIS, COMPLETE (UACMP) WITH MICROSCOPIC
Bilirubin Urine: NEGATIVE
GLUCOSE, UA: NEGATIVE mg/dL
KETONES UR: NEGATIVE mg/dL
NITRITE: NEGATIVE
PH: 5 (ref 5.0–8.0)
Protein, ur: NEGATIVE mg/dL
Specific Gravity, Urine: 1.016 (ref 1.005–1.030)

## 2017-11-26 LAB — BASIC METABOLIC PANEL
Anion gap: 10 (ref 5–15)
BUN: 20 mg/dL (ref 8–23)
CO2: 24 mmol/L (ref 22–32)
CREATININE: 0.99 mg/dL (ref 0.44–1.00)
Calcium: 8.9 mg/dL (ref 8.9–10.3)
Chloride: 102 mmol/L (ref 98–111)
GFR calc non Af Amer: 48 mL/min — ABNORMAL LOW (ref >60)
GFR, EST AFRICAN AMERICAN: 56 mL/min — AB (ref >60)
Glucose, Bld: 102 mg/dL — ABNORMAL HIGH (ref 70–99)
Potassium: 3.6 mmol/L (ref 3.5–5.1)
Sodium: 136 mmol/L (ref 135–145)

## 2017-11-26 LAB — CBC
HCT: 42.6 % (ref 35.0–47.0)
Hemoglobin: 14.4 g/dL (ref 12.0–16.0)
MCH: 29.8 pg (ref 26.0–34.0)
MCHC: 33.7 g/dL (ref 32.0–36.0)
MCV: 88.2 fL (ref 80.0–100.0)
PLATELETS: 286 10*3/uL (ref 150–440)
RBC: 4.83 MIL/uL (ref 3.80–5.20)
RDW: 14.4 % (ref 11.5–14.5)
WBC: 6.6 10*3/uL (ref 3.6–11.0)

## 2017-11-26 LAB — TSH: TSH: 2.156 u[IU]/mL (ref 0.350–4.500)

## 2017-11-26 LAB — TROPONIN I: Troponin I: <0.03 ng/mL (ref <0.03)

## 2017-11-26 MED ORDER — SODIUM CHLORIDE 0.9 % IV BOLUS
500.0000 mL | Freq: Once | INTRAVENOUS | Status: AC
  Administered 2017-11-26: 500 mL via INTRAVENOUS

## 2017-11-26 NOTE — ED Provider Notes (Signed)
[facility] Emergency Department Provider Note ____________________________________________   First MD Initiated Contact with Patient 11/26/17 1738     (approximate)  I have reviewed the triage vital signs and the nursing notes.   HISTORY  Chief Complaint Weakness    HPI Mandy George is a 84 y.o. female with PMH as noted below who presents with elevated blood pressure, measured to 203/115 last night, associated with feeling of generalized weakness.  Patient states that she took an extra dose of higher hydralazine last night and the blood pressure did improve today, but she started to feel weak again today.  She denies chest pain, severe headache, lightheadedness, or difficulty breathing.  No other focal symptoms except for generalized weakness.  She does report some chronic mid back pain which she has had for at least several weeks but this is not changed today.  Past Medical History:  Diagnosis Date  . Diverticulitis   . Hypercholesteremia   . Hypertension   . Pancreatitis   . Thyroid activity decreased     Patient Active Problem List   Diagnosis Date Noted  . Hyponatremia 08/11/2017    Past Surgical History:  Procedure Laterality Date  . TONSILLECTOMY      Prior to Admission medications   Medication Sig Start Date End Date Taking? Authorizing Provider  fexofenadine (ALLEGRA) 180 MG tablet Take 180 mg by mouth daily.    [provider]  hydrALAZINE (APRESOLINE) 50 MG tablet Take 50 mg by mouth 2 (two) times daily. 07/27/17   [provider]  levothyroxine (SYNTHROID, LEVOTHROID) 88 MCG tablet Take 1 tablet by mouth every morning. 01/03/15   [provider]  losartan (COZAAR) 100 MG tablet Take 100 mg by mouth daily. 08/03/17   [provider]  metoprolol succinate (TOPROL-XL) 25 MG 24 hr tablet Take 25 mg by mouth daily. 12/12/14   [provider]  Vitamin D, Ergocalciferol, (DRISDOL) 50000 units CAPS  capsule Take 1 capsule by mouth once a week. 07/28/17   [provider]    Allergies Ivp dye [iodinated diagnostic agents]; Eggs or egg-derived products; Mushroom extract complex; and Shrimp [shellfish allergy]  Family History  Problem Relation Age of Onset  . Thyroid disease Mother   . Hypertension Mother   . Hyperlipidemia Sister   . Hyperlipidemia Brother     Social History Social History   Tobacco Use  . Smoking status: Never Smoker  . Smokeless tobacco: Never Used  Substance Use Topics  . Alcohol use: Yes    Comment: occassional  . Drug use: No    Review of Systems  Constitutional: No fever.  Positive for generalized weakness. Eyes: No visual changes. ENT: No neck pain. Cardiovascular: Denies chest pain. Respiratory: Denies shortness of breath. Gastrointestinal: No nausea or vomiting. Genitourinary: Negative for dysuria.  Musculoskeletal: Positive for back pain. Skin: Negative for rash. Neurological: Negative for headaches, focal weakness or numbness.   ____________________________________________   PHYSICAL EXAM:  VITAL SIGNS: ED Triage Vitals  Enc Vitals Group     BP 11/26/17 1621 (!) 169/88     Pulse Rate 11/26/17 1621 72     Resp 11/26/17 1621 18     Temp 11/26/17 1621 98.1 F (36.7 C)     Temp Source 11/26/17 1621 Oral     SpO2 11/26/17 1621 97 %     Weight 11/26/17 1616 110 lb (49.9 kg)     Height 11/26/17 1616 5\' 2"  (1.575 m)  Head Circumference --      Peak Flow --      Pain Score 11/26/17 1618 0     Pain Loc --      Pain Edu? --      Excl. in GC? --     Constitutional: Alert and oriented. Well appearing for age and in no acute distress. Eyes: Conjunctivae are normal.  Head: Atraumatic. Nose: No congestion/rhinnorhea. Mouth/Throat: Mucous membranes are moist.   Neck: Normal range of motion.  Cardiovascular: Normal rate, regular rhythm. Grossly normal heart sounds.  Good peripheral circulation. Respiratory: Normal  respiratory effort.  No retractions. Lungs CTAB. Gastrointestinal: Soft and nontender. No distention.  Genitourinary: No flank tenderness. Musculoskeletal: No lower extremity edema.  Extremities warm and well perfused.  Neurologic:  Normal speech and language. No gross focal neurologic deficits are appreciated.  Skin:  Skin is warm and dry. No rash noted. Psychiatric: Mood and affect are normal. Speech and behavior are normal.  ____________________________________________   LABS (all labs ordered are listed, but only abnormal results are displayed)  Labs Reviewed  BASIC METABOLIC PANEL - Abnormal; Notable for the following components:      Result Value   Glucose, Bld 102 (*)    GFR calc non Af Amer 48 (*)    GFR calc Af Amer 56 (*)    All other components within normal limits  URINALYSIS, COMPLETE (UACMP) WITH MICROSCOPIC - Abnormal; Notable for the following components:   Color, Urine YELLOW (*)    APPearance CLEAR (*)    Hgb urine dipstick SMALL (*)    Leukocytes, UA SMALL (*)    Bacteria, UA RARE (*)    All other components within normal limits  CBC  TROPONIN I  TSH   ____________________________________________  EKG  ED ECG REPORT I, Dionne Bucy, the attending physician, personally viewed and interpreted this ECG.  Date: 11/26/2017 EKG Time: 1620 Rate: 75 Rhythm: normal sinus rhythm QRS Axis: normal Intervals: normal ST/T Wave abnormalities: normal Narrative Interpretation: no evidence of acute ischemia  ____________________________________________  RADIOLOGY    ____________________________________________   PROCEDURES  Procedure(s) performed: No  Procedures  Critical Care performed: No ____________________________________________   INITIAL IMPRESSION / ASSESSMENT AND PLAN / ED COURSE  Pertinent labs & imaging results that were available during my care of the patient were reviewed by me and considered in my medical decision making (see  chart for details).  84 year old female with PMH as noted above presents with hypertension and with some generalized weakness which occurred last night, improved and then recurred today.  She denies chest pain, difficulty breathing, or other specific focal symptoms except for some back pain which she has had for a few weeks and which is actually improved today.  On exam, the patient is well-appearing, the blood pressure is somewhat elevated but not concerningly so, and the other vital signs are normal.  Neuro exam is nonfocal, and the remainder of the exam is unremarkable.  Initial labs and UA are within normal limits, and EKG is nonischemic.  At this time, there is no evidence of hypertensive emergency.  The etiology of the patient's mild generalized weakness is not certain, but differential given the negative work-up so far includes mild dehydration or less likely cardiac etiology.  We will obtain a troponin, and the daughter also requested that we get a TSH; the patient states she recently was noncompliant with her thyroid medication although has been taking it as prescribed the last several days.  I will  also give a small fluid bolus.  ----------------------------------------- 8:06 PM on 11/26/2017 -----------------------------------------  The troponin is negative, and the TSH is normal.  The patient is feeling better after fluids and the blood pressures continue to improve on its own.  I counseled the patient and her daughter on the results of the work-up.  Patient feels comfortable and would like to go home.  Return precautions given, and she expresses understanding.  She agrees to follow-up with her regular doctor.  ____________________________________________   FINAL CLINICAL IMPRESSION(S) / ED DIAGNOSES  Final diagnoses:  Generalized weakness  Hypertension, unspecified type      NEW MEDICATIONS STARTED DURING THIS VISIT:  New Prescriptions   No medications on file      Note:  This document was prepared using Dragon voice recognition software and may include unintentional dictation errors.    Dionne Bucy, MD 11/26/17 2007

## 2017-11-26 NOTE — ED Triage Notes (Signed)
Pt states, "I started getting sick last night and it's my back and my blood pressure". Pt states she took a medication at home for a week without relief. Pt's daughter reports that pt took hydralazine 50mg  at home due to increased BP that she does not take every day. Pt reports feeling weak in the knees at this time. Pt is HOH but otherwise alert and oriented X 4.

## 2017-11-26 NOTE — Discharge Instructions (Signed)
Continue to take your regular medications as prescribed.  Follow-up with your regular doctor within the next 1 to 2 weeks.  Return to the ER for new, worsening, or persistent weakness, severely elevated blood pressure, chest pain, difficulty breathing, or any other new or worsening symptoms that concern you.

## 2017-12-01 ENCOUNTER — Other Ambulatory Visit: Payer: Self-pay

## 2017-12-01 ENCOUNTER — Emergency Department: Payer: Medicare Other

## 2017-12-01 ENCOUNTER — Emergency Department
Admission: EM | Admit: 2017-12-01 | Discharge: 2017-12-01 | Disposition: A | Discharge status: Home / Self Care | Payer: Medicare Other | Attending: Emergency Medicine | Admitting: Emergency Medicine

## 2017-12-01 DIAGNOSIS — I10 Essential (primary) hypertension: Secondary | ICD-10-CM | POA: Insufficient documentation

## 2017-12-01 DIAGNOSIS — R531 Weakness: Secondary | ICD-10-CM | POA: Diagnosis present

## 2017-12-01 LAB — URINALYSIS, COMPLETE (UACMP) WITH MICROSCOPIC
Bacteria, UA: NONE SEEN
Bilirubin Urine: NEGATIVE
Glucose, UA: NEGATIVE mg/dL
Ketones, ur: NEGATIVE mg/dL
Leukocytes, UA: NEGATIVE
Nitrite: NEGATIVE
PROTEIN: NEGATIVE mg/dL
SPECIFIC GRAVITY, URINE: 1.005 (ref 1.005–1.030)
Squamous Epithelial / LPF: NONE SEEN (ref 0–5)
pH: 6 (ref 5.0–8.0)

## 2017-12-01 LAB — CBC
HEMATOCRIT: 42.7 % (ref 35.0–47.0)
HEMOGLOBIN: 14.6 g/dL (ref 12.0–16.0)
MCH: 29.8 pg (ref 26.0–34.0)
MCHC: 34.2 g/dL (ref 32.0–36.0)
MCV: 87.1 fL (ref 80.0–100.0)
Platelets: 290 10*3/uL (ref 150–440)
RBC: 4.89 MIL/uL (ref 3.80–5.20)
RDW: 14.6 % — ABNORMAL HIGH (ref 11.5–14.5)
WBC: 6.1 10*3/uL (ref 3.6–11.0)

## 2017-12-01 LAB — BASIC METABOLIC PANEL
ANION GAP: 8 (ref 5–15)
BUN: 18 mg/dL (ref 8–23)
CALCIUM: 9.3 mg/dL (ref 8.9–10.3)
CO2: 27 mmol/L (ref 22–32)
Chloride: 100 mmol/L (ref 98–111)
Creatinine, Ser: 1.08 mg/dL — ABNORMAL HIGH (ref 0.44–1.00)
GFR calc Af Amer: 50 mL/min — ABNORMAL LOW (ref >60)
GFR calc non Af Amer: 43 mL/min — ABNORMAL LOW (ref >60)
GLUCOSE: 87 mg/dL (ref 70–99)
Potassium: 3.8 mmol/L (ref 3.5–5.1)
Sodium: 135 mmol/L (ref 135–145)

## 2017-12-01 NOTE — ED Triage Notes (Signed)
Pt arrives to ED via POV from home with c/o weakness, "not feeling good", and "not thinking straight". Pt states she was seen here for same last week; [place]CP [place] so she came in for evaluation. Pt reports her s/x's started after eating dinner. Pt reports (+) nausea, but denies vomiting or diarrhea. No CP, ABD pain, no SHOB.

## 2017-12-01 NOTE — ED Provider Notes (Signed)
[facility] Emergency Department Provider Note       Time seen: ----------------------------------------- 8:46 PM on 12/01/2017 -----------------------------------------   I have reviewed the triage vital signs and the nursing notes.  HISTORY   Chief Complaint Weakness    HPI Mandy George is a 84 y.o. female with a history of diverticulitis, hyperlipidemia, hypertension, pancreatitis who presents to the ED for weakness and not thinking clearly.  Patient states she was seen for 5 days ago for weakness but tonight had difficulty thinking clearly.  Her primary care doctor is out of the country so she came in for an evaluation.  She denies fevers, chills, chest pain, shortness of breath, vomiting or diarrhea.  Past Medical History:  Diagnosis Date  . Diverticulitis   . Hypercholesteremia   . Hypertension   . Pancreatitis   . Thyroid activity decreased     Patient Active Problem List   Diagnosis Date Noted  . Hyponatremia 08/11/2017    Past Surgical History:  Procedure Laterality Date  . TONSILLECTOMY      Allergies Ivp dye [iodinated diagnostic agents]; Eggs or egg-derived products; Mushroom extract complex; and Shrimp [shellfish allergy]  Social History Social History   Tobacco Use  . Smoking status: Never Smoker  . Smokeless tobacco: Never Used  Substance Use Topics  . Alcohol use: Yes    Comment: occassional  . Drug use: No   Review of Systems Constitutional: Negative for fever. Cardiovascular: Negative for chest pain. Respiratory: Negative for shortness of breath. Gastrointestinal: Negative for abdominal pain, vomiting and diarrhea. Musculoskeletal: Negative for back pain. Skin: Negative for rash. Neurological: Positive for weakness and confusion  All systems negative/normal/unremarkable except as stated in the HPI  ____________________________________________   PHYSICAL EXAM:  VITAL SIGNS: ED Triage Vitals  Enc Vitals  Group     BP 12/01/17 1944 (!) 192/101     Pulse Rate 12/01/17 1944 75     Resp 12/01/17 1944 17     Temp 12/01/17 1944 98.3 F (36.8 C)     Temp Source 12/01/17 1944 Oral     SpO2 12/01/17 1944 96 %     Weight 12/01/17 1942 110 lb (49.9 kg)     Height 12/01/17 1942 5\' 2"  (1.575 m)     Head Circumference --      Peak Flow --      Pain Score 12/01/17 1942 0     Pain Loc --      Pain Edu? --      Excl. in GC? --    Constitutional: Alert and oriented. Well appearing and in no distress. Eyes: Conjunctivae are normal. Normal extraocular movements. Cardiovascular: Normal rate, regular rhythm. No murmurs, rubs, or gallops. Respiratory: Normal respiratory effort without tachypnea nor retractions. Breath sounds are clear and equal bilaterally. No wheezes/rales/rhonchi. Gastrointestinal: Soft and nontender. Normal bowel sounds Musculoskeletal: Nontender with normal range of motion in extremities. No lower extremity tenderness nor edema. Neurologic:  Normal speech and language. No gross focal neurologic deficits are appreciated.  Skin:  Skin is warm, dry and intact. No rash noted. Psychiatric: Mood and affect are normal. Speech and behavior are normal.  ____________________________________________  EKG: Interpreted by me.  Sinus rhythm rate 70 bpm, normal PR interval, normal QRS, normal QT  ____________________________________________  ED COURSE:  As part of my medical decision making, I reviewed the following data within the electronic MEDICAL RECORD NUMBER History obtained from family if available, nursing notes, old chart and ekg, as well  as notes from prior ED visits. Patient presented for weakness and not thinking clearly, we will assess with labs and imaging as indicated at this time.   Procedures ____________________________________________   LABS (pertinent positives/negatives)  Labs Reviewed  BASIC METABOLIC PANEL - Abnormal; Notable for the following components:      Result  Value   Creatinine, Ser 1.08 (*)    GFR calc non Af Amer 43 (*)    GFR calc Af Amer 50 (*)    All other components within normal limits  CBC - Abnormal; Notable for the following components:   RDW 14.6 (*)    All other components within normal limits  URINALYSIS, COMPLETE (UACMP) WITH MICROSCOPIC - Abnormal; Notable for the following components:   Color, Urine STRAW (*)    APPearance CLEAR (*)    Hgb urine dipstick SMALL (*)    All other components within normal limits  CBG MONITORING, ED    RADIOLOGY Images were viewed by me  CT head is unremarkable  ____________________________________________  DIFFERENTIAL DIAGNOSIS   Normal aging process, dehydration, electrolyte abnormality, occult infection, CVA, MI  FINAL ASSESSMENT AND PLAN  Weakness   Plan: The patient had presented for weakness and not thinking clearly. Patient's labs were unremarkable. Patient's imaging is also unremarkable.  No clear etiology for her weakness.  She is cleared for outpatient follow-up.   Ulice Dash, MD   Note: This note was generated in part or whole with voice recognition software. Voice recognition is usually quite accurate but there are transcription errors that can and very often do occur. I apologize for any typographical errors that were not detected and corrected.     Emily Filbert, MD 12/01/17 [phone]

## 2018-02-24 ENCOUNTER — Encounter (INDEPENDENT_AMBULATORY_CARE_PROVIDER_SITE_OTHER): Payer: Medicare Other | Admitting: Ophthalmology

## 2018-02-24 DIAGNOSIS — H353231 Exudative age-related macular degeneration, bilateral, with active choroidal neovascularization: Secondary | ICD-10-CM | POA: Diagnosis not present

## 2018-02-24 DIAGNOSIS — I10 Essential (primary) hypertension: Secondary | ICD-10-CM

## 2018-02-24 DIAGNOSIS — H43813 Vitreous degeneration, bilateral: Secondary | ICD-10-CM | POA: Diagnosis not present

## 2018-02-24 DIAGNOSIS — H35033 Hypertensive retinopathy, bilateral: Secondary | ICD-10-CM

## 2018-11-25 ENCOUNTER — Encounter (INDEPENDENT_AMBULATORY_CARE_PROVIDER_SITE_OTHER): Payer: Medicare Other | Admitting: Ophthalmology

## 2020-04-05 DEATH — deceased
